# Patient Record
Sex: Female | Born: 1935
Health system: Southern US, Community
[De-identification: ages and names within clinical notes are randomized; demographics above are authoritative.]

## PROBLEM LIST (undated history)

## (undated) DIAGNOSIS — Z860100 Personal history of colon polyps, unspecified: Secondary | ICD-10-CM

## (undated) DIAGNOSIS — E785 Hyperlipidemia, unspecified: Secondary | ICD-10-CM

## (undated) DIAGNOSIS — M1990 Unspecified osteoarthritis, unspecified site: Secondary | ICD-10-CM

## (undated) DIAGNOSIS — I10 Essential (primary) hypertension: Secondary | ICD-10-CM

## (undated) DIAGNOSIS — K219 Gastro-esophageal reflux disease without esophagitis: Secondary | ICD-10-CM

## (undated) DIAGNOSIS — E079 Disorder of thyroid, unspecified: Secondary | ICD-10-CM

## (undated) DIAGNOSIS — T7840XA Allergy, unspecified, initial encounter: Secondary | ICD-10-CM

## (undated) HISTORY — DX: Hyperlipidemia, unspecified: E78.5

## (undated) HISTORY — DX: Gastro-esophageal reflux disease without esophagitis: K21.9

## (undated) HISTORY — DX: Unspecified osteoarthritis, unspecified site: M19.90

## (undated) HISTORY — DX: Essential (primary) hypertension: I10

## (undated) HISTORY — DX: Allergy, unspecified, initial encounter: T78.40XA

## (undated) HISTORY — DX: Personal history of colon polyps, unspecified: Z86.0100

## (undated) HISTORY — DX: Disorder of thyroid, unspecified: E07.9

## (undated) HISTORY — DX: Personal history of colonic polyps: Z86.010

---
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
## 1978-04-10 HISTORY — PX: BREAST EXCISIONAL BIOPSY: SUR124

## 2004-12-15 ENCOUNTER — Ambulatory Visit: Payer: Self-pay | Admitting: Internal Medicine

## 2005-12-19 ENCOUNTER — Ambulatory Visit: Payer: Self-pay | Admitting: Internal Medicine

## 2006-12-27 ENCOUNTER — Ambulatory Visit: Payer: Self-pay | Admitting: Internal Medicine

## 2007-12-17 ENCOUNTER — Ambulatory Visit: Payer: Self-pay | Admitting: Internal Medicine

## 2008-04-13 ENCOUNTER — Ambulatory Visit: Payer: Self-pay | Admitting: General Surgery

## 2009-04-10 HISTORY — PX: COLONOSCOPY: SHX174

## 2009-04-19 ENCOUNTER — Ambulatory Visit: Payer: Self-pay | Admitting: General Surgery

## 2009-12-10 ENCOUNTER — Ambulatory Visit: Payer: Self-pay | Admitting: General Surgery

## 2009-12-14 LAB — PATHOLOGY REPORT

## 2010-04-20 ENCOUNTER — Ambulatory Visit: Payer: Self-pay | Admitting: Internal Medicine

## 2011-04-24 ENCOUNTER — Ambulatory Visit: Payer: Self-pay | Admitting: Internal Medicine

## 2012-04-24 ENCOUNTER — Ambulatory Visit: Payer: Self-pay | Admitting: Internal Medicine

## 2012-10-22 ENCOUNTER — Encounter: Payer: Self-pay | Admitting: *Deleted

## 2013-04-25 ENCOUNTER — Ambulatory Visit: Payer: Self-pay | Admitting: Internal Medicine

## 2013-04-30 ENCOUNTER — Ambulatory Visit: Payer: Self-pay | Admitting: Internal Medicine

## 2014-02-09 ENCOUNTER — Encounter: Payer: Self-pay | Admitting: *Deleted

## 2014-04-27 ENCOUNTER — Ambulatory Visit: Payer: Self-pay | Admitting: Internal Medicine

## 2014-05-04 ENCOUNTER — Ambulatory Visit: Payer: Self-pay | Admitting: General Surgery

## 2014-05-18 ENCOUNTER — Ambulatory Visit (INDEPENDENT_AMBULATORY_CARE_PROVIDER_SITE_OTHER): Payer: Medicare Other | Admitting: General Surgery

## 2014-05-18 ENCOUNTER — Encounter: Payer: Self-pay | Admitting: General Surgery

## 2014-05-18 VITALS — BP 122/60 | HR 70 | Resp 13 | Ht 62.0 in | Wt 129.0 lb

## 2014-05-18 DIAGNOSIS — Z86010 Personal history of colonic polyps: Secondary | ICD-10-CM

## 2014-05-18 MED ORDER — POLYETHYLENE GLYCOL 3350 17 GM/SCOOP PO POWD: ORAL | Status: DC

## 2014-05-18 NOTE — Patient Instructions (Addendum)
Patient has been scheduled for a colonoscopy on 06-10-14 at [facility].  Colonoscopy A colonoscopy is an exam to look at the entire large intestine (colon). This exam can help find problems such as tumors, polyps, inflammation, and areas of bleeding. The exam takes about 1 hour.  LET [facility] CARE PROVIDER KNOW ABOUT:   Any allergies you have.  All medicines you are taking, including vitamins, herbs, eye drops, creams, and over-the-counter medicines.  Previous problems you or members of your family have had with the use of anesthetics.  Any blood disorders you have.  Previous surgeries you have had.  Medical conditions you have. RISKS AND COMPLICATIONS  Generally, this is a safe procedure. However, as with any procedure, complications can occur. Possible complications include:  Bleeding.  Tearing or rupture of the colon wall.  Reaction to medicines given during the exam.  Infection (rare). BEFORE THE PROCEDURE   Ask your health care provider about changing or stopping your regular medicines.  You may be prescribed an oral bowel prep. This involves drinking a large amount of medicated liquid, starting the day before your procedure. The liquid will cause you to have multiple loose stools until your stool is almost clear or light green. This cleans out your colon in preparation for the procedure.  Do not eat or drink anything else once you have started the bowel prep, unless your health care provider tells you it is safe to do so.  Arrange for someone to drive you home after the procedure. PROCEDURE   You will be given medicine to help you relax (sedative).  You will lie on your side with your knees bent.  A long, flexible tube with a light and camera on the end (colonoscope) will be inserted through the rectum and into the colon. The camera sends video back to a computer screen as it moves through the colon. The colonoscope also releases carbon dioxide gas to inflate the colon. This  helps your health care provider see the area better.  During the exam, your health care provider may take a small tissue sample (biopsy) to be examined under a microscope if any abnormalities are found.  The exam is finished when the entire colon has been viewed. AFTER THE PROCEDURE   Do not drive for 24 hours after the exam.  You may have a small amount of blood in your stool.  You may pass moderate amounts of gas and have mild abdominal cramping or bloating. This is caused by the gas used to inflate your colon during the exam.  Ask when your test results will be ready and how you will get your results. Make sure you get your test results. Document Released: 03/24/2000 Document Revised: 01/15/2013 Document Reviewed: 12/02/2012 [facility] Patient Information 2015 [place], [place]. This information is not intended to replace advice given to you by your health care provider. Make sure you discuss any questions you have with your health care provider.

## 2014-05-18 NOTE — Progress Notes (Signed)
Patient ID: Michele Jordan, female   DOB: December 08, 1932, 81 y.o.   MRN: [id number]  Chief Complaint  Patient presents with  . Other    colonoscopy    HPI Michele Jordan is a 81 y.o. female here today for a evaluation of a surveillance colonoscopy. Last colonoscopy was done 12/10/09 and had a serrated adenoma removed.She states no GI problems at this time.  HPI  Past Medical History  Diagnosis Date  . Arthritis   . Hypertension   . Hyperlipidemia   . Personal history of colonic polyps     Past Surgical History  Procedure Laterality Date  . Appendectomy  1950  . Breast biopsy Left 1980  . Colonoscopy  2011    History reviewed. No pertinent family history.  Social History History  Substance Use Topics  . Smoking status: Never Smoker   . Smokeless tobacco: Not on file  . Alcohol Use: No    No Known Allergies  Current Outpatient Prescriptions  Medication Sig Dispense Refill  . cetirizine (ZYRTEC) 10 MG tablet Take 10 mg by mouth daily.    Marland Kitchen esomeprazole (NEXIUM) 20 MG capsule Take 20 mg by mouth daily at 12 noon.    . fluticasone (FLONASE) 50 MCG/ACT nasal spray Place into both nostrils daily.    Marland Kitchen lisinopril-hydrochlorothiazide (PRINZIDE,ZESTORETIC) 20-12.5 MG per tablet Take 1 tablet by mouth daily.    . simvastatin (ZOCOR) 40 MG tablet Take 40 mg by mouth daily.    . traMADol (ULTRAM) 50 MG tablet Take by mouth every 6 (six) hours as needed.    . polyethylene glycol powder (GLYCOLAX/MIRALAX) powder 255 grams one bottle for colonoscopy prep 255 g 0   No current facility-administered medications for this visit.    Review of Systems Review of Systems  Constitutional: Negative.   Respiratory: Negative.   Cardiovascular: Negative.     Blood pressure 122/60, pulse 70, resp. rate 13, height 5\' 2"  (1.575 m), weight 129 lb (58.514 kg).  Physical Exam Physical Exam  Constitutional: She is oriented to person, place, and time. She appears well-developed and  well-nourished.  Eyes: Conjunctivae are normal. No scleral icterus.  Neck: Neck supple.  Cardiovascular: Normal rate, regular rhythm and normal heart sounds.   Pulmonary/Chest: Effort normal and breath sounds normal.  Abdominal: Soft. Bowel sounds are normal. There is no tenderness.  Lymphadenopathy:    She has no cervical adenopathy.  Neurological: She is alert and oriented to person, place, and time.  Skin: Skin is warm and dry.    Data Reviewed None  Assessment    History of colon propyl     Plan      Discussed colonoscopy, patient agrees.   Patient has been scheduled for a colonoscopy on 06-10-14 at [facility].   SANKAR,SEEPLAPUTHUR G 05/18/2014, 6:36 PM

## 2014-06-03 ENCOUNTER — Other Ambulatory Visit: Payer: Self-pay | Admitting: General Surgery

## 2014-06-03 DIAGNOSIS — Z86010 Personal history of colonic polyps: Secondary | ICD-10-CM

## 2014-06-10 ENCOUNTER — Ambulatory Visit: Payer: Self-pay | Admitting: General Surgery

## 2014-06-10 DIAGNOSIS — Z86010 Personal history of colonic polyps: Secondary | ICD-10-CM | POA: Diagnosis not present

## 2014-06-11 ENCOUNTER — Encounter: Payer: Self-pay | Admitting: General Surgery

## 2014-07-28 ENCOUNTER — Encounter: Payer: Self-pay | Admitting: General Surgery

## 2015-04-27 ENCOUNTER — Other Ambulatory Visit: Payer: Self-pay | Admitting: Internal Medicine

## 2015-04-27 DIAGNOSIS — Z1231 Encounter for screening mammogram for malignant neoplasm of breast: Secondary | ICD-10-CM

## 2015-05-04 ENCOUNTER — Ambulatory Visit
Admission: RE | Admit: 2015-05-04 | Discharge: 2015-05-04 | Disposition: A | Discharge status: Home / Self Care | Payer: PPO | Source: Ambulatory Visit | Attending: Internal Medicine | Admitting: Internal Medicine

## 2015-05-04 DIAGNOSIS — Z1231 Encounter for screening mammogram for malignant neoplasm of breast: Secondary | ICD-10-CM | POA: Insufficient documentation

## 2015-08-16 DIAGNOSIS — N183 Chronic kidney disease, stage 3 (moderate): Secondary | ICD-10-CM | POA: Diagnosis not present

## 2015-08-16 DIAGNOSIS — R739 Hyperglycemia, unspecified: Secondary | ICD-10-CM | POA: Diagnosis not present

## 2015-08-16 DIAGNOSIS — I129 Hypertensive chronic kidney disease with stage 1 through stage 4 chronic kidney disease, or unspecified chronic kidney disease: Secondary | ICD-10-CM | POA: Diagnosis not present

## 2015-08-16 DIAGNOSIS — E7800 Pure hypercholesterolemia, unspecified: Secondary | ICD-10-CM | POA: Diagnosis not present

## 2015-08-23 DIAGNOSIS — I129 Hypertensive chronic kidney disease with stage 1 through stage 4 chronic kidney disease, or unspecified chronic kidney disease: Secondary | ICD-10-CM | POA: Diagnosis not present

## 2015-08-23 DIAGNOSIS — E039 Hypothyroidism, unspecified: Secondary | ICD-10-CM | POA: Diagnosis not present

## 2015-08-23 DIAGNOSIS — E7800 Pure hypercholesterolemia, unspecified: Secondary | ICD-10-CM | POA: Diagnosis not present

## 2015-08-23 DIAGNOSIS — R739 Hyperglycemia, unspecified: Secondary | ICD-10-CM | POA: Diagnosis not present

## 2015-08-23 DIAGNOSIS — N183 Chronic kidney disease, stage 3 (moderate): Secondary | ICD-10-CM | POA: Diagnosis not present

## 2016-01-18 DIAGNOSIS — Z23 Encounter for immunization: Secondary | ICD-10-CM | POA: Diagnosis not present

## 2016-02-23 DIAGNOSIS — L821 Other seborrheic keratosis: Secondary | ICD-10-CM | POA: Diagnosis not present

## 2016-02-23 DIAGNOSIS — L570 Actinic keratosis: Secondary | ICD-10-CM | POA: Diagnosis not present

## 2016-02-23 DIAGNOSIS — D2261 Melanocytic nevi of right upper limb, including shoulder: Secondary | ICD-10-CM | POA: Diagnosis not present

## 2016-02-23 DIAGNOSIS — D2272 Melanocytic nevi of left lower limb, including hip: Secondary | ICD-10-CM | POA: Diagnosis not present

## 2016-02-23 DIAGNOSIS — D225 Melanocytic nevi of trunk: Secondary | ICD-10-CM | POA: Diagnosis not present

## 2016-02-23 DIAGNOSIS — D485 Neoplasm of uncertain behavior of skin: Secondary | ICD-10-CM | POA: Diagnosis not present

## 2016-03-09 DIAGNOSIS — H04123 Dry eye syndrome of bilateral lacrimal glands: Secondary | ICD-10-CM | POA: Diagnosis not present

## 2016-03-09 DIAGNOSIS — H2513 Age-related nuclear cataract, bilateral: Secondary | ICD-10-CM | POA: Diagnosis not present

## 2016-03-09 DIAGNOSIS — I10 Essential (primary) hypertension: Secondary | ICD-10-CM | POA: Diagnosis not present

## 2016-03-09 DIAGNOSIS — H16223 Keratoconjunctivitis sicca, not specified as Sjogren's, bilateral: Secondary | ICD-10-CM | POA: Diagnosis not present

## 2016-03-09 DIAGNOSIS — H35033 Hypertensive retinopathy, bilateral: Secondary | ICD-10-CM | POA: Diagnosis not present

## 2016-03-09 DIAGNOSIS — H35013 Changes in retinal vascular appearance, bilateral: Secondary | ICD-10-CM | POA: Diagnosis not present

## 2016-03-09 DIAGNOSIS — H35433 Paving stone degeneration of retina, bilateral: Secondary | ICD-10-CM | POA: Diagnosis not present

## 2016-03-15 DIAGNOSIS — I129 Hypertensive chronic kidney disease with stage 1 through stage 4 chronic kidney disease, or unspecified chronic kidney disease: Secondary | ICD-10-CM | POA: Diagnosis not present

## 2016-03-15 DIAGNOSIS — N183 Chronic kidney disease, stage 3 (moderate): Secondary | ICD-10-CM | POA: Diagnosis not present

## 2016-03-15 DIAGNOSIS — E039 Hypothyroidism, unspecified: Secondary | ICD-10-CM | POA: Diagnosis not present

## 2016-03-15 DIAGNOSIS — E7800 Pure hypercholesterolemia, unspecified: Secondary | ICD-10-CM | POA: Diagnosis not present

## 2016-03-15 DIAGNOSIS — X32XXXA Exposure to sunlight, initial encounter: Secondary | ICD-10-CM | POA: Diagnosis not present

## 2016-03-15 DIAGNOSIS — L570 Actinic keratosis: Secondary | ICD-10-CM | POA: Diagnosis not present

## 2016-03-20 DIAGNOSIS — N183 Chronic kidney disease, stage 3 (moderate): Secondary | ICD-10-CM | POA: Diagnosis not present

## 2016-03-20 DIAGNOSIS — E7800 Pure hypercholesterolemia, unspecified: Secondary | ICD-10-CM | POA: Diagnosis not present

## 2016-03-20 DIAGNOSIS — I129 Hypertensive chronic kidney disease with stage 1 through stage 4 chronic kidney disease, or unspecified chronic kidney disease: Secondary | ICD-10-CM | POA: Diagnosis not present

## 2016-03-20 DIAGNOSIS — E039 Hypothyroidism, unspecified: Secondary | ICD-10-CM | POA: Diagnosis not present

## 2016-03-20 DIAGNOSIS — R739 Hyperglycemia, unspecified: Secondary | ICD-10-CM | POA: Diagnosis not present

## 2016-03-20 DIAGNOSIS — Z0000 Encounter for general adult medical examination without abnormal findings: Secondary | ICD-10-CM | POA: Diagnosis not present

## 2016-03-29 ENCOUNTER — Other Ambulatory Visit: Payer: Self-pay | Admitting: Internal Medicine

## 2016-03-29 DIAGNOSIS — Z1231 Encounter for screening mammogram for malignant neoplasm of breast: Secondary | ICD-10-CM

## 2016-05-04 ENCOUNTER — Ambulatory Visit
Admission: RE | Admit: 2016-05-04 | Discharge: 2016-05-04 | Disposition: A | Discharge status: Home / Self Care | Payer: PPO | Source: Ambulatory Visit | Attending: Internal Medicine | Admitting: Internal Medicine

## 2016-05-04 DIAGNOSIS — Z1231 Encounter for screening mammogram for malignant neoplasm of breast: Secondary | ICD-10-CM | POA: Insufficient documentation

## 2016-05-10 ENCOUNTER — Other Ambulatory Visit: Payer: Self-pay | Admitting: Internal Medicine

## 2016-05-10 DIAGNOSIS — R928 Other abnormal and inconclusive findings on diagnostic imaging of breast: Secondary | ICD-10-CM

## 2016-05-10 DIAGNOSIS — N6320 Unspecified lump in the left breast, unspecified quadrant: Secondary | ICD-10-CM

## 2016-05-23 ENCOUNTER — Ambulatory Visit
Admission: RE | Admit: 2016-05-23 | Discharge: 2016-05-23 | Disposition: A | Discharge status: Home / Self Care | Payer: PPO | Source: Ambulatory Visit | Attending: Internal Medicine | Admitting: Internal Medicine

## 2016-05-23 DIAGNOSIS — N63 Unspecified lump in unspecified breast: Secondary | ICD-10-CM | POA: Diagnosis not present

## 2016-05-23 DIAGNOSIS — N6002 Solitary cyst of left breast: Secondary | ICD-10-CM | POA: Diagnosis not present

## 2016-05-23 DIAGNOSIS — R928 Other abnormal and inconclusive findings on diagnostic imaging of breast: Secondary | ICD-10-CM

## 2016-05-23 DIAGNOSIS — N6320 Unspecified lump in the left breast, unspecified quadrant: Secondary | ICD-10-CM

## 2016-09-11 DIAGNOSIS — E7800 Pure hypercholesterolemia, unspecified: Secondary | ICD-10-CM | POA: Diagnosis not present

## 2016-09-11 DIAGNOSIS — N183 Chronic kidney disease, stage 3 (moderate): Secondary | ICD-10-CM | POA: Diagnosis not present

## 2016-09-11 DIAGNOSIS — E039 Hypothyroidism, unspecified: Secondary | ICD-10-CM | POA: Diagnosis not present

## 2016-09-11 DIAGNOSIS — I129 Hypertensive chronic kidney disease with stage 1 through stage 4 chronic kidney disease, or unspecified chronic kidney disease: Secondary | ICD-10-CM | POA: Diagnosis not present

## 2016-09-11 DIAGNOSIS — R739 Hyperglycemia, unspecified: Secondary | ICD-10-CM | POA: Diagnosis not present

## 2016-09-18 DIAGNOSIS — I129 Hypertensive chronic kidney disease with stage 1 through stage 4 chronic kidney disease, or unspecified chronic kidney disease: Secondary | ICD-10-CM | POA: Diagnosis not present

## 2016-09-18 DIAGNOSIS — E7800 Pure hypercholesterolemia, unspecified: Secondary | ICD-10-CM | POA: Diagnosis not present

## 2016-09-18 DIAGNOSIS — N183 Chronic kidney disease, stage 3 (moderate): Secondary | ICD-10-CM | POA: Diagnosis not present

## 2016-09-18 DIAGNOSIS — E039 Hypothyroidism, unspecified: Secondary | ICD-10-CM | POA: Diagnosis not present

## 2016-09-18 DIAGNOSIS — R739 Hyperglycemia, unspecified: Secondary | ICD-10-CM | POA: Diagnosis not present

## 2016-12-13 DIAGNOSIS — E039 Hypothyroidism, unspecified: Secondary | ICD-10-CM | POA: Diagnosis not present

## 2016-12-20 DIAGNOSIS — N183 Chronic kidney disease, stage 3 (moderate): Secondary | ICD-10-CM | POA: Diagnosis not present

## 2016-12-20 DIAGNOSIS — I129 Hypertensive chronic kidney disease with stage 1 through stage 4 chronic kidney disease, or unspecified chronic kidney disease: Secondary | ICD-10-CM | POA: Diagnosis not present

## 2016-12-20 DIAGNOSIS — E7800 Pure hypercholesterolemia, unspecified: Secondary | ICD-10-CM | POA: Diagnosis not present

## 2016-12-20 DIAGNOSIS — Z0000 Encounter for general adult medical examination without abnormal findings: Secondary | ICD-10-CM | POA: Diagnosis not present

## 2016-12-20 DIAGNOSIS — E039 Hypothyroidism, unspecified: Secondary | ICD-10-CM | POA: Diagnosis not present

## 2016-12-20 DIAGNOSIS — R739 Hyperglycemia, unspecified: Secondary | ICD-10-CM | POA: Diagnosis not present

## 2016-12-29 DIAGNOSIS — Z23 Encounter for immunization: Secondary | ICD-10-CM | POA: Diagnosis not present

## 2017-02-21 DIAGNOSIS — X32XXXA Exposure to sunlight, initial encounter: Secondary | ICD-10-CM | POA: Diagnosis not present

## 2017-02-21 DIAGNOSIS — L570 Actinic keratosis: Secondary | ICD-10-CM | POA: Diagnosis not present

## 2017-02-21 DIAGNOSIS — L821 Other seborrheic keratosis: Secondary | ICD-10-CM | POA: Diagnosis not present

## 2017-03-06 DIAGNOSIS — H5203 Hypermetropia, bilateral: Secondary | ICD-10-CM | POA: Diagnosis not present

## 2017-03-06 DIAGNOSIS — H52223 Regular astigmatism, bilateral: Secondary | ICD-10-CM | POA: Diagnosis not present

## 2017-03-06 DIAGNOSIS — H524 Presbyopia: Secondary | ICD-10-CM | POA: Diagnosis not present

## 2017-03-06 DIAGNOSIS — H04123 Dry eye syndrome of bilateral lacrimal glands: Secondary | ICD-10-CM | POA: Diagnosis not present

## 2017-03-06 DIAGNOSIS — H2513 Age-related nuclear cataract, bilateral: Secondary | ICD-10-CM | POA: Diagnosis not present

## 2017-04-26 ENCOUNTER — Other Ambulatory Visit: Payer: Self-pay | Admitting: Internal Medicine

## 2017-04-26 DIAGNOSIS — Z1231 Encounter for screening mammogram for malignant neoplasm of breast: Secondary | ICD-10-CM

## 2017-05-24 ENCOUNTER — Ambulatory Visit
Admission: RE | Admit: 2017-05-24 | Discharge: 2017-05-24 | Disposition: A | Discharge status: Home / Self Care | Payer: PPO | Source: Ambulatory Visit | Attending: Internal Medicine | Admitting: Internal Medicine

## 2017-05-24 DIAGNOSIS — Z1231 Encounter for screening mammogram for malignant neoplasm of breast: Secondary | ICD-10-CM

## 2017-06-13 DIAGNOSIS — N183 Chronic kidney disease, stage 3 (moderate): Secondary | ICD-10-CM | POA: Diagnosis not present

## 2017-06-13 DIAGNOSIS — R739 Hyperglycemia, unspecified: Secondary | ICD-10-CM | POA: Diagnosis not present

## 2017-06-13 DIAGNOSIS — I129 Hypertensive chronic kidney disease with stage 1 through stage 4 chronic kidney disease, or unspecified chronic kidney disease: Secondary | ICD-10-CM | POA: Diagnosis not present

## 2017-06-13 DIAGNOSIS — E7800 Pure hypercholesterolemia, unspecified: Secondary | ICD-10-CM | POA: Diagnosis not present

## 2017-06-13 DIAGNOSIS — E039 Hypothyroidism, unspecified: Secondary | ICD-10-CM | POA: Diagnosis not present

## 2017-06-20 DIAGNOSIS — Z0000 Encounter for general adult medical examination without abnormal findings: Secondary | ICD-10-CM | POA: Diagnosis not present

## 2017-06-20 DIAGNOSIS — E039 Hypothyroidism, unspecified: Secondary | ICD-10-CM | POA: Diagnosis not present

## 2017-06-20 DIAGNOSIS — E7800 Pure hypercholesterolemia, unspecified: Secondary | ICD-10-CM | POA: Diagnosis not present

## 2017-06-20 DIAGNOSIS — I129 Hypertensive chronic kidney disease with stage 1 through stage 4 chronic kidney disease, or unspecified chronic kidney disease: Secondary | ICD-10-CM | POA: Diagnosis not present

## 2017-06-20 DIAGNOSIS — R739 Hyperglycemia, unspecified: Secondary | ICD-10-CM | POA: Diagnosis not present

## 2017-06-20 DIAGNOSIS — F5104 Psychophysiologic insomnia: Secondary | ICD-10-CM | POA: Diagnosis not present

## 2017-06-20 DIAGNOSIS — N183 Chronic kidney disease, stage 3 (moderate): Secondary | ICD-10-CM | POA: Diagnosis not present

## 2017-12-20 DIAGNOSIS — E7800 Pure hypercholesterolemia, unspecified: Secondary | ICD-10-CM | POA: Diagnosis not present

## 2017-12-20 DIAGNOSIS — E039 Hypothyroidism, unspecified: Secondary | ICD-10-CM | POA: Diagnosis not present

## 2017-12-20 DIAGNOSIS — R739 Hyperglycemia, unspecified: Secondary | ICD-10-CM | POA: Diagnosis not present

## 2017-12-27 DIAGNOSIS — I129 Hypertensive chronic kidney disease with stage 1 through stage 4 chronic kidney disease, or unspecified chronic kidney disease: Secondary | ICD-10-CM | POA: Diagnosis not present

## 2017-12-27 DIAGNOSIS — E7800 Pure hypercholesterolemia, unspecified: Secondary | ICD-10-CM | POA: Diagnosis not present

## 2017-12-27 DIAGNOSIS — E039 Hypothyroidism, unspecified: Secondary | ICD-10-CM | POA: Diagnosis not present

## 2017-12-27 DIAGNOSIS — N183 Chronic kidney disease, stage 3 (moderate): Secondary | ICD-10-CM | POA: Diagnosis not present

## 2017-12-27 DIAGNOSIS — R739 Hyperglycemia, unspecified: Secondary | ICD-10-CM | POA: Diagnosis not present

## 2018-01-18 DIAGNOSIS — Z23 Encounter for immunization: Secondary | ICD-10-CM | POA: Diagnosis not present

## 2018-01-24 ENCOUNTER — Encounter: Payer: Self-pay | Admitting: Dietician

## 2018-01-24 ENCOUNTER — Encounter: Payer: PPO | Attending: Internal Medicine | Admitting: Dietician

## 2018-01-24 VITALS — Ht 61.5 in | Wt 136.3 lb

## 2018-01-24 DIAGNOSIS — E785 Hyperlipidemia, unspecified: Secondary | ICD-10-CM | POA: Insufficient documentation

## 2018-01-24 DIAGNOSIS — N183 Chronic kidney disease, stage 3 unspecified: Secondary | ICD-10-CM

## 2018-01-24 DIAGNOSIS — N189 Chronic kidney disease, unspecified: Secondary | ICD-10-CM | POA: Diagnosis not present

## 2018-01-24 DIAGNOSIS — I129 Hypertensive chronic kidney disease with stage 1 through stage 4 chronic kidney disease, or unspecified chronic kidney disease: Secondary | ICD-10-CM | POA: Insufficient documentation

## 2018-01-24 DIAGNOSIS — I10 Essential (primary) hypertension: Secondary | ICD-10-CM

## 2018-01-24 DIAGNOSIS — Z713 Dietary counseling and surveillance: Secondary | ICD-10-CM | POA: Insufficient documentation

## 2018-01-24 NOTE — Progress Notes (Signed)
Medical Nutrition Therapy: Visit start time: 1330  end time: 1430  Assessment:  Diagnosis: HTN, CKD Past medical history: hyperlipidemia, kidney stones, recent elevated HbA1C Psychosocial issues/ stress concerns: none  Preferred learning method:  . Auditory . Visual . Hands-on   Current weight: 136.3lbs Height: 5'1.5" Medications, supplements: reconciled list in medical record  Progress and evaluation: Patient reports recent elevation in HbA1C, with result of 6.6%, and BG of 117. She is working to follow a low sodium diet to manage blood pressure and kidney health. She denies any other dietary restrictions at this time. Recent GFR was 36. She cooks meals almost daily at home, and would like help with options for easy to prepare meals while maintaining healthy diet.    Physical activity: walk, stationary bike, other strength building machines 1 hour 3x a week  Dietary Intake:  Usual eating pattern includes 3 meals and 0-1 snacks per day. Dining out frequency: 1 meals per week.  Breakfast: 1c coffee with powdered creamer, no sugar + cereal (low sugar) or scrambled egg with toast 1 slice; breakfast bar Snack: none Lunch: 12pm chicken pie, chicken salad; egg rolls with steamed cabbage and green beans (occ casserole) or squash; spaghetti with small amount meat (sausage), chicken and rice casserole; fruit salad apple blueberries, grapes, tangerines with cool whip Snack: fruit salad if hungry Supper: sandwich with pimento cheese or grilled cheese or [place]; chicken salad on lettuce; sometimes cereal if not very hungry Snack: usually none Beverages: water, 1c coffee, no tea d/t history of kidney stones  Nutrition Care Education: Topics covered: low sodium diet, diabetes Basic nutrition: basic food groups, appropriate nutrient balance, appropriate meal and snack schedule, general nutrition guidelines    Advanced nutrition: food label reading Diabetes: appropriate carb intake and balance,  basic meal planning using plate method, options for balanced meals and to avoid excess cooking time.  Hypertension/ CKD: identifying high sodium foods, identifying food sources of potassium, magnesium  Nutritional Diagnosis:  [place]-2.2 Altered nutrition-related laboratory As related to CKD and hypertension, diabetes.  As evidenced by patient with GFR of 36, and HbA1C of 6.6%.  Intervention: Instruction as noted above.   Patient is generally making healthy food choices and controlling food portions.    She will use resources such as menus provided for additional healthy options.    No follow-up scheduled at this time; patient to call with any questions or concerns.   Education Materials given:  . Plate Planner with food lists . Sample menus . Diabetes-friendly recipes . Goals/ instructions   Learner/ who was taught:  . Patient  . Family member: daughter Barnett Applebaum   Level of understanding: Marland Kitchen Verbalizes/ demonstrates competency   Demonstrated degree of understanding via:   Teach back Learning barriers: . None  Willingness to learn/ readiness for change: . Eager, change in progress   Monitoring and Evaluation:  Dietary intake, exercise, renal function, BG control, and body weight      follow up: prn

## 2018-01-24 NOTE — Patient Instructions (Signed)
   Continue to eat balanced meals with small portions of starches, plenty of low-carb veggies, and lean protein foods in moderate portions (palm-size).   Try Mrs. Dash seasonings for flavoring foods without sodium.   Keep drinking plenty of water.

## 2018-02-20 DIAGNOSIS — X32XXXA Exposure to sunlight, initial encounter: Secondary | ICD-10-CM | POA: Diagnosis not present

## 2018-02-20 DIAGNOSIS — L570 Actinic keratosis: Secondary | ICD-10-CM | POA: Diagnosis not present

## 2018-02-20 DIAGNOSIS — L821 Other seborrheic keratosis: Secondary | ICD-10-CM | POA: Diagnosis not present

## 2018-03-12 DIAGNOSIS — H25813 Combined forms of age-related cataract, bilateral: Secondary | ICD-10-CM | POA: Diagnosis not present

## 2018-05-02 ENCOUNTER — Other Ambulatory Visit: Payer: Self-pay | Admitting: Internal Medicine

## 2018-05-02 DIAGNOSIS — Z1231 Encounter for screening mammogram for malignant neoplasm of breast: Secondary | ICD-10-CM

## 2018-05-27 ENCOUNTER — Ambulatory Visit
Admission: RE | Admit: 2018-05-27 | Discharge: 2018-05-27 | Disposition: A | Discharge status: Home / Self Care | Payer: PPO | Source: Ambulatory Visit | Attending: Internal Medicine | Admitting: Internal Medicine

## 2018-05-27 DIAGNOSIS — Z1231 Encounter for screening mammogram for malignant neoplasm of breast: Secondary | ICD-10-CM | POA: Insufficient documentation

## 2018-06-20 DIAGNOSIS — I129 Hypertensive chronic kidney disease with stage 1 through stage 4 chronic kidney disease, or unspecified chronic kidney disease: Secondary | ICD-10-CM | POA: Diagnosis not present

## 2018-06-20 DIAGNOSIS — R739 Hyperglycemia, unspecified: Secondary | ICD-10-CM | POA: Diagnosis not present

## 2018-06-20 DIAGNOSIS — E039 Hypothyroidism, unspecified: Secondary | ICD-10-CM | POA: Diagnosis not present

## 2018-06-20 DIAGNOSIS — N183 Chronic kidney disease, stage 3 (moderate): Secondary | ICD-10-CM | POA: Diagnosis not present

## 2018-10-02 DIAGNOSIS — I129 Hypertensive chronic kidney disease with stage 1 through stage 4 chronic kidney disease, or unspecified chronic kidney disease: Secondary | ICD-10-CM | POA: Diagnosis not present

## 2018-10-02 DIAGNOSIS — E039 Hypothyroidism, unspecified: Secondary | ICD-10-CM | POA: Diagnosis not present

## 2018-10-02 DIAGNOSIS — E119 Type 2 diabetes mellitus without complications: Secondary | ICD-10-CM | POA: Diagnosis not present

## 2018-10-02 DIAGNOSIS — E7800 Pure hypercholesterolemia, unspecified: Secondary | ICD-10-CM | POA: Diagnosis not present

## 2018-10-02 DIAGNOSIS — Z0000 Encounter for general adult medical examination without abnormal findings: Secondary | ICD-10-CM | POA: Diagnosis not present

## 2018-10-02 DIAGNOSIS — N183 Chronic kidney disease, stage 3 (moderate): Secondary | ICD-10-CM | POA: Diagnosis not present

## 2018-10-02 DIAGNOSIS — N2581 Secondary hyperparathyroidism of renal origin: Secondary | ICD-10-CM | POA: Diagnosis not present

## 2019-02-19 DIAGNOSIS — D045 Carcinoma in situ of skin of trunk: Secondary | ICD-10-CM | POA: Diagnosis not present

## 2019-02-19 DIAGNOSIS — X32XXXA Exposure to sunlight, initial encounter: Secondary | ICD-10-CM | POA: Diagnosis not present

## 2019-02-19 DIAGNOSIS — L570 Actinic keratosis: Secondary | ICD-10-CM | POA: Diagnosis not present

## 2019-02-19 DIAGNOSIS — L821 Other seborrheic keratosis: Secondary | ICD-10-CM | POA: Diagnosis not present

## 2019-02-19 DIAGNOSIS — D2272 Melanocytic nevi of left lower limb, including hip: Secondary | ICD-10-CM | POA: Diagnosis not present

## 2019-02-19 DIAGNOSIS — D2261 Melanocytic nevi of right upper limb, including shoulder: Secondary | ICD-10-CM | POA: Diagnosis not present

## 2019-02-19 DIAGNOSIS — D225 Melanocytic nevi of trunk: Secondary | ICD-10-CM | POA: Diagnosis not present

## 2019-02-19 DIAGNOSIS — D2271 Melanocytic nevi of right lower limb, including hip: Secondary | ICD-10-CM | POA: Diagnosis not present

## 2019-02-19 DIAGNOSIS — D2262 Melanocytic nevi of left upper limb, including shoulder: Secondary | ICD-10-CM | POA: Diagnosis not present

## 2019-02-19 DIAGNOSIS — D485 Neoplasm of uncertain behavior of skin: Secondary | ICD-10-CM | POA: Diagnosis not present

## 2019-03-13 DIAGNOSIS — D045 Carcinoma in situ of skin of trunk: Secondary | ICD-10-CM | POA: Diagnosis not present

## 2019-03-13 DIAGNOSIS — L570 Actinic keratosis: Secondary | ICD-10-CM | POA: Diagnosis not present

## 2019-03-31 DIAGNOSIS — E119 Type 2 diabetes mellitus without complications: Secondary | ICD-10-CM | POA: Diagnosis not present

## 2019-03-31 DIAGNOSIS — I129 Hypertensive chronic kidney disease with stage 1 through stage 4 chronic kidney disease, or unspecified chronic kidney disease: Secondary | ICD-10-CM | POA: Diagnosis not present

## 2019-03-31 DIAGNOSIS — N183 Chronic kidney disease, stage 3 unspecified: Secondary | ICD-10-CM | POA: Diagnosis not present

## 2019-03-31 DIAGNOSIS — E039 Hypothyroidism, unspecified: Secondary | ICD-10-CM | POA: Diagnosis not present

## 2019-03-31 DIAGNOSIS — E7800 Pure hypercholesterolemia, unspecified: Secondary | ICD-10-CM | POA: Diagnosis not present

## 2019-04-07 DIAGNOSIS — E039 Hypothyroidism, unspecified: Secondary | ICD-10-CM | POA: Diagnosis not present

## 2019-04-07 DIAGNOSIS — E7800 Pure hypercholesterolemia, unspecified: Secondary | ICD-10-CM | POA: Diagnosis not present

## 2019-04-07 DIAGNOSIS — N2581 Secondary hyperparathyroidism of renal origin: Secondary | ICD-10-CM | POA: Diagnosis not present

## 2019-04-07 DIAGNOSIS — E119 Type 2 diabetes mellitus without complications: Secondary | ICD-10-CM | POA: Diagnosis not present

## 2019-04-07 DIAGNOSIS — N183 Chronic kidney disease, stage 3 unspecified: Secondary | ICD-10-CM | POA: Diagnosis not present

## 2019-04-07 DIAGNOSIS — I129 Hypertensive chronic kidney disease with stage 1 through stage 4 chronic kidney disease, or unspecified chronic kidney disease: Secondary | ICD-10-CM | POA: Diagnosis not present

## 2019-04-21 ENCOUNTER — Other Ambulatory Visit: Payer: Self-pay | Admitting: Internal Medicine

## 2019-04-21 DIAGNOSIS — Z1231 Encounter for screening mammogram for malignant neoplasm of breast: Secondary | ICD-10-CM

## 2019-07-07 ENCOUNTER — Ambulatory Visit
Admission: RE | Admit: 2019-07-07 | Discharge: 2019-07-07 | Disposition: A | Discharge status: Home / Self Care | Payer: PPO | Source: Ambulatory Visit | Attending: Internal Medicine | Admitting: Internal Medicine

## 2019-07-07 DIAGNOSIS — Z1231 Encounter for screening mammogram for malignant neoplasm of breast: Secondary | ICD-10-CM | POA: Diagnosis not present

## 2019-07-29 DIAGNOSIS — H25813 Combined forms of age-related cataract, bilateral: Secondary | ICD-10-CM | POA: Diagnosis not present

## 2019-09-29 DIAGNOSIS — N183 Chronic kidney disease, stage 3 unspecified: Secondary | ICD-10-CM | POA: Diagnosis not present

## 2019-09-29 DIAGNOSIS — E039 Hypothyroidism, unspecified: Secondary | ICD-10-CM | POA: Diagnosis not present

## 2019-09-29 DIAGNOSIS — E7800 Pure hypercholesterolemia, unspecified: Secondary | ICD-10-CM | POA: Diagnosis not present

## 2019-09-29 DIAGNOSIS — I129 Hypertensive chronic kidney disease with stage 1 through stage 4 chronic kidney disease, or unspecified chronic kidney disease: Secondary | ICD-10-CM | POA: Diagnosis not present

## 2019-09-29 DIAGNOSIS — E119 Type 2 diabetes mellitus without complications: Secondary | ICD-10-CM | POA: Diagnosis not present

## 2019-10-06 DIAGNOSIS — Z0000 Encounter for general adult medical examination without abnormal findings: Secondary | ICD-10-CM | POA: Diagnosis not present

## 2019-10-06 DIAGNOSIS — E039 Hypothyroidism, unspecified: Secondary | ICD-10-CM | POA: Diagnosis not present

## 2019-10-06 DIAGNOSIS — N183 Chronic kidney disease, stage 3 unspecified: Secondary | ICD-10-CM | POA: Diagnosis not present

## 2019-10-06 DIAGNOSIS — I129 Hypertensive chronic kidney disease with stage 1 through stage 4 chronic kidney disease, or unspecified chronic kidney disease: Secondary | ICD-10-CM | POA: Diagnosis not present

## 2019-10-06 DIAGNOSIS — E119 Type 2 diabetes mellitus without complications: Secondary | ICD-10-CM | POA: Diagnosis not present

## 2019-10-06 DIAGNOSIS — N2581 Secondary hyperparathyroidism of renal origin: Secondary | ICD-10-CM | POA: Diagnosis not present

## 2020-02-20 DIAGNOSIS — L538 Other specified erythematous conditions: Secondary | ICD-10-CM | POA: Diagnosis not present

## 2020-02-20 DIAGNOSIS — L821 Other seborrheic keratosis: Secondary | ICD-10-CM | POA: Diagnosis not present

## 2020-02-20 DIAGNOSIS — L570 Actinic keratosis: Secondary | ICD-10-CM | POA: Diagnosis not present

## 2020-02-20 DIAGNOSIS — L820 Inflamed seborrheic keratosis: Secondary | ICD-10-CM | POA: Diagnosis not present

## 2020-02-20 DIAGNOSIS — L298 Other pruritus: Secondary | ICD-10-CM | POA: Diagnosis not present

## 2020-03-30 DIAGNOSIS — E119 Type 2 diabetes mellitus without complications: Secondary | ICD-10-CM | POA: Diagnosis not present

## 2020-03-30 DIAGNOSIS — E039 Hypothyroidism, unspecified: Secondary | ICD-10-CM | POA: Diagnosis not present

## 2020-03-30 DIAGNOSIS — I129 Hypertensive chronic kidney disease with stage 1 through stage 4 chronic kidney disease, or unspecified chronic kidney disease: Secondary | ICD-10-CM | POA: Diagnosis not present

## 2020-03-30 DIAGNOSIS — N183 Chronic kidney disease, stage 3 unspecified: Secondary | ICD-10-CM | POA: Diagnosis not present

## 2020-04-12 DIAGNOSIS — E119 Type 2 diabetes mellitus without complications: Secondary | ICD-10-CM | POA: Diagnosis not present

## 2020-04-12 DIAGNOSIS — I129 Hypertensive chronic kidney disease with stage 1 through stage 4 chronic kidney disease, or unspecified chronic kidney disease: Secondary | ICD-10-CM | POA: Diagnosis not present

## 2020-04-12 DIAGNOSIS — E039 Hypothyroidism, unspecified: Secondary | ICD-10-CM | POA: Diagnosis not present

## 2020-04-12 DIAGNOSIS — E7800 Pure hypercholesterolemia, unspecified: Secondary | ICD-10-CM | POA: Diagnosis not present

## 2020-04-12 DIAGNOSIS — N183 Chronic kidney disease, stage 3 unspecified: Secondary | ICD-10-CM | POA: Diagnosis not present

## 2020-04-12 DIAGNOSIS — N2581 Secondary hyperparathyroidism of renal origin: Secondary | ICD-10-CM | POA: Diagnosis not present

## 2020-06-11 ENCOUNTER — Other Ambulatory Visit: Payer: Self-pay | Admitting: Internal Medicine

## 2020-06-11 DIAGNOSIS — Z1231 Encounter for screening mammogram for malignant neoplasm of breast: Secondary | ICD-10-CM

## 2020-07-07 ENCOUNTER — Ambulatory Visit
Admission: RE | Admit: 2020-07-07 | Discharge: 2020-07-07 | Disposition: A | Discharge status: Home / Self Care | Payer: PPO | Source: Ambulatory Visit | Attending: Internal Medicine | Admitting: Internal Medicine

## 2020-07-07 ENCOUNTER — Other Ambulatory Visit: Payer: Self-pay

## 2020-07-07 DIAGNOSIS — Z1231 Encounter for screening mammogram for malignant neoplasm of breast: Secondary | ICD-10-CM | POA: Diagnosis not present

## 2020-07-29 DIAGNOSIS — H25813 Combined forms of age-related cataract, bilateral: Secondary | ICD-10-CM | POA: Diagnosis not present

## 2020-10-05 DIAGNOSIS — I129 Hypertensive chronic kidney disease with stage 1 through stage 4 chronic kidney disease, or unspecified chronic kidney disease: Secondary | ICD-10-CM | POA: Diagnosis not present

## 2020-10-05 DIAGNOSIS — E039 Hypothyroidism, unspecified: Secondary | ICD-10-CM | POA: Diagnosis not present

## 2020-10-05 DIAGNOSIS — E119 Type 2 diabetes mellitus without complications: Secondary | ICD-10-CM | POA: Diagnosis not present

## 2020-10-05 DIAGNOSIS — N183 Chronic kidney disease, stage 3 unspecified: Secondary | ICD-10-CM | POA: Diagnosis not present

## 2020-10-12 DIAGNOSIS — Z0000 Encounter for general adult medical examination without abnormal findings: Secondary | ICD-10-CM | POA: Diagnosis not present

## 2020-10-12 DIAGNOSIS — I129 Hypertensive chronic kidney disease with stage 1 through stage 4 chronic kidney disease, or unspecified chronic kidney disease: Secondary | ICD-10-CM | POA: Diagnosis not present

## 2020-10-12 DIAGNOSIS — N183 Chronic kidney disease, stage 3 unspecified: Secondary | ICD-10-CM | POA: Diagnosis not present

## 2020-10-12 DIAGNOSIS — N2581 Secondary hyperparathyroidism of renal origin: Secondary | ICD-10-CM | POA: Diagnosis not present

## 2020-10-12 DIAGNOSIS — E7800 Pure hypercholesterolemia, unspecified: Secondary | ICD-10-CM | POA: Diagnosis not present

## 2020-10-12 DIAGNOSIS — E039 Hypothyroidism, unspecified: Secondary | ICD-10-CM | POA: Diagnosis not present

## 2020-10-12 DIAGNOSIS — E119 Type 2 diabetes mellitus without complications: Secondary | ICD-10-CM | POA: Diagnosis not present

## 2021-02-24 DIAGNOSIS — L570 Actinic keratosis: Secondary | ICD-10-CM | POA: Diagnosis not present

## 2021-02-24 DIAGNOSIS — L821 Other seborrheic keratosis: Secondary | ICD-10-CM | POA: Diagnosis not present

## 2021-02-24 DIAGNOSIS — D1801 Hemangioma of skin and subcutaneous tissue: Secondary | ICD-10-CM | POA: Diagnosis not present

## 2021-02-24 DIAGNOSIS — S90121A Contusion of right lesser toe(s) without damage to nail, initial encounter: Secondary | ICD-10-CM | POA: Diagnosis not present

## 2021-02-24 DIAGNOSIS — X32XXXA Exposure to sunlight, initial encounter: Secondary | ICD-10-CM | POA: Diagnosis not present

## 2021-04-07 DIAGNOSIS — N183 Chronic kidney disease, stage 3 unspecified: Secondary | ICD-10-CM | POA: Diagnosis not present

## 2021-04-07 DIAGNOSIS — E7800 Pure hypercholesterolemia, unspecified: Secondary | ICD-10-CM | POA: Diagnosis not present

## 2021-04-07 DIAGNOSIS — E119 Type 2 diabetes mellitus without complications: Secondary | ICD-10-CM | POA: Diagnosis not present

## 2021-04-07 DIAGNOSIS — I129 Hypertensive chronic kidney disease with stage 1 through stage 4 chronic kidney disease, or unspecified chronic kidney disease: Secondary | ICD-10-CM | POA: Diagnosis not present

## 2021-04-07 DIAGNOSIS — E039 Hypothyroidism, unspecified: Secondary | ICD-10-CM | POA: Diagnosis not present

## 2021-04-14 DIAGNOSIS — N183 Chronic kidney disease, stage 3 unspecified: Secondary | ICD-10-CM | POA: Diagnosis not present

## 2021-04-14 DIAGNOSIS — E7800 Pure hypercholesterolemia, unspecified: Secondary | ICD-10-CM | POA: Diagnosis not present

## 2021-04-14 DIAGNOSIS — I129 Hypertensive chronic kidney disease with stage 1 through stage 4 chronic kidney disease, or unspecified chronic kidney disease: Secondary | ICD-10-CM | POA: Diagnosis not present

## 2021-04-14 DIAGNOSIS — E118 Type 2 diabetes mellitus with unspecified complications: Secondary | ICD-10-CM | POA: Diagnosis not present

## 2021-04-14 DIAGNOSIS — E039 Hypothyroidism, unspecified: Secondary | ICD-10-CM | POA: Diagnosis not present

## 2021-04-14 DIAGNOSIS — N2581 Secondary hyperparathyroidism of renal origin: Secondary | ICD-10-CM | POA: Diagnosis not present

## 2021-05-30 ENCOUNTER — Other Ambulatory Visit: Payer: Self-pay | Admitting: Internal Medicine

## 2021-05-30 DIAGNOSIS — Z1231 Encounter for screening mammogram for malignant neoplasm of breast: Secondary | ICD-10-CM

## 2021-07-08 ENCOUNTER — Ambulatory Visit
Admission: RE | Admit: 2021-07-08 | Discharge: 2021-07-08 | Disposition: A | Discharge status: Home / Self Care | Payer: PPO | Source: Ambulatory Visit | Attending: Internal Medicine | Admitting: Internal Medicine

## 2021-07-08 DIAGNOSIS — Z1231 Encounter for screening mammogram for malignant neoplasm of breast: Secondary | ICD-10-CM | POA: Insufficient documentation

## 2021-08-01 DIAGNOSIS — H25813 Combined forms of age-related cataract, bilateral: Secondary | ICD-10-CM | POA: Diagnosis not present

## 2021-10-12 DIAGNOSIS — I129 Hypertensive chronic kidney disease with stage 1 through stage 4 chronic kidney disease, or unspecified chronic kidney disease: Secondary | ICD-10-CM | POA: Diagnosis not present

## 2021-10-12 DIAGNOSIS — E7800 Pure hypercholesterolemia, unspecified: Secondary | ICD-10-CM | POA: Diagnosis not present

## 2021-10-12 DIAGNOSIS — N183 Chronic kidney disease, stage 3 unspecified: Secondary | ICD-10-CM | POA: Diagnosis not present

## 2021-10-12 DIAGNOSIS — E039 Hypothyroidism, unspecified: Secondary | ICD-10-CM | POA: Diagnosis not present

## 2021-10-12 DIAGNOSIS — E118 Type 2 diabetes mellitus with unspecified complications: Secondary | ICD-10-CM | POA: Diagnosis not present

## 2021-10-18 DIAGNOSIS — E7800 Pure hypercholesterolemia, unspecified: Secondary | ICD-10-CM | POA: Diagnosis not present

## 2021-10-18 DIAGNOSIS — N183 Chronic kidney disease, stage 3 unspecified: Secondary | ICD-10-CM | POA: Diagnosis not present

## 2021-10-18 DIAGNOSIS — Z0000 Encounter for general adult medical examination without abnormal findings: Secondary | ICD-10-CM | POA: Diagnosis not present

## 2021-10-18 DIAGNOSIS — N2581 Secondary hyperparathyroidism of renal origin: Secondary | ICD-10-CM | POA: Diagnosis not present

## 2021-10-18 DIAGNOSIS — E118 Type 2 diabetes mellitus with unspecified complications: Secondary | ICD-10-CM | POA: Diagnosis not present

## 2021-10-18 DIAGNOSIS — I129 Hypertensive chronic kidney disease with stage 1 through stage 4 chronic kidney disease, or unspecified chronic kidney disease: Secondary | ICD-10-CM | POA: Diagnosis not present

## 2021-10-18 DIAGNOSIS — E039 Hypothyroidism, unspecified: Secondary | ICD-10-CM | POA: Diagnosis not present

## 2021-11-22 DIAGNOSIS — J069 Acute upper respiratory infection, unspecified: Secondary | ICD-10-CM | POA: Diagnosis not present

## 2021-12-16 ENCOUNTER — Other Ambulatory Visit: Payer: Self-pay

## 2021-12-16 ENCOUNTER — Emergency Department: Payer: PPO

## 2021-12-16 ENCOUNTER — Emergency Department
Admission: EM | Admit: 2021-12-16 | Discharge: 2021-12-16 | Disposition: A | Discharge status: Home / Self Care | Payer: PPO | Attending: Emergency Medicine | Admitting: Emergency Medicine

## 2021-12-16 DIAGNOSIS — S32592A Other specified fracture of left pubis, initial encounter for closed fracture: Secondary | ICD-10-CM | POA: Insufficient documentation

## 2021-12-16 DIAGNOSIS — W010XXA Fall on same level from slipping, tripping and stumbling without subsequent striking against object, initial encounter: Secondary | ICD-10-CM | POA: Insufficient documentation

## 2021-12-16 DIAGNOSIS — G319 Degenerative disease of nervous system, unspecified: Secondary | ICD-10-CM | POA: Insufficient documentation

## 2021-12-16 DIAGNOSIS — S32512A Fracture of superior rim of left pubis, initial encounter for closed fracture: Secondary | ICD-10-CM | POA: Insufficient documentation

## 2021-12-16 DIAGNOSIS — I10 Essential (primary) hypertension: Secondary | ICD-10-CM | POA: Diagnosis not present

## 2021-12-16 DIAGNOSIS — W19XXXA Unspecified fall, initial encounter: Secondary | ICD-10-CM | POA: Diagnosis not present

## 2021-12-16 DIAGNOSIS — M4317 Spondylolisthesis, lumbosacral region: Secondary | ICD-10-CM | POA: Diagnosis not present

## 2021-12-16 DIAGNOSIS — I739 Peripheral vascular disease, unspecified: Secondary | ICD-10-CM | POA: Diagnosis not present

## 2021-12-16 DIAGNOSIS — I6789 Other cerebrovascular disease: Secondary | ICD-10-CM | POA: Diagnosis not present

## 2021-12-16 DIAGNOSIS — S79912A Unspecified injury of left hip, initial encounter: Secondary | ICD-10-CM | POA: Diagnosis not present

## 2021-12-16 DIAGNOSIS — M47816 Spondylosis without myelopathy or radiculopathy, lumbar region: Secondary | ICD-10-CM | POA: Diagnosis not present

## 2021-12-16 DIAGNOSIS — M25552 Pain in left hip: Secondary | ICD-10-CM | POA: Diagnosis not present

## 2021-12-16 DIAGNOSIS — S0990XA Unspecified injury of head, initial encounter: Secondary | ICD-10-CM | POA: Diagnosis not present

## 2021-12-16 DIAGNOSIS — S37899A Unspecified injury of other urinary and pelvic organ, initial encounter: Secondary | ICD-10-CM | POA: Diagnosis present

## 2021-12-16 DIAGNOSIS — S32502A Unspecified fracture of left pubis, initial encounter for closed fracture: Secondary | ICD-10-CM | POA: Diagnosis not present

## 2021-12-16 NOTE — ED Provider Notes (Signed)
[facility] Provider Note    Event Date/Time   First MD Initiated Contact with Patient 12/16/21 1926     (approximate)   History   Fall   HPI  Michele Jordan is a 88 y.o. female with history of hypertension, arthritis, hyperlipidemia presents emergency department after a fall.  Patient states that her daughter's been ill and was unstable and fell into her causing her to fall back.  Patient landed on her bottom and left side.  States it hurts to lift her left leg.  No numbness or tingling.  No head injury.      Physical Exam   Triage Vital Signs: ED Triage Vitals  Enc Vitals Group     BP 12/16/21 1845 (!) 154/60     Pulse Rate 12/16/21 1845 71     Resp 12/16/21 1845 17     Temp 12/16/21 1845 98.8 F (37.1 C)     Temp src --      SpO2 12/16/21 1845 96 %     Weight --      Height --      Head Circumference --      Peak Flow --      Pain Score 12/16/21 1840 6     Pain Loc --      Pain Edu? --      Excl. in [place]? --     Most recent vital signs: Vitals:   12/16/21 1845  BP: (!) 154/60  Pulse: 71  Resp: 17  Temp: 98.8 F (37.1 C)  SpO2: 96%     General: Awake, no distress.   CV:  Good peripheral perfusion. regular rate and  rhythm Resp:  Normal effort.  Abd:  No distention.   Other:  Left hip is tender to palpation, shortening of the left leg but this is normal for the patient.,  States she has to wear a lift in her shoe, pain is reproduced with lifting of the left leg   ED Results / Procedures / Treatments   Labs (all labs ordered are listed, but only abnormal results are displayed) Labs Reviewed - No data to display   EKG     RADIOLOGY X-ray of the left hip, CT of the pelvis    PROCEDURES:   Procedures   MEDICATIONS ORDERED IN ED: Medications - No data to display   IMPRESSION / MDM / [place] / ED COURSE  I reviewed the triage vital signs and the nursing notes.                               Differential diagnosis includes, but is not limited to, fall, contusion, fracture, sprain, subdural  Patient's presentation is most consistent with acute presentation with potential threat to life or bodily function.   Due to the patient's age and recent fall we will do CT of the head   X-ray of the left hip shows a superior and inferior rami fracture  CT of the pelvis ordered to ensure there is not an occult fracture of the left hip   CT of the head independently reviewed and interpreted by me as being negative for any acute abnormality  CT of the pelvis independently reviewed and interpreted by me as showing the acute inferior and superior rami fracture  Consult orthopedics.  Dr.Poggi states to bear weight as tolerated.  She should use a walker.  Follow-up in his  office in 1 week,  I did explain everything to the patient, her son, her grandson who are all aware that she should only bear weight as tolerated.  If she is worsening they will need to bring her back to the emergency department.  I did explain to them that she should only be up doing a few activities and not taking care of everyone at her home.  I will contact social work for consult for home health aide to help her with a bath and other activities in the home.  Her husband is debilitated and she has a daughter that is extremely sick that is also staying with them.  The patient has been her sole caregiver.  The patient was discharged in stable condition in the care of her family.  She was given a prescription for DME walker.  She is to take Tylenol for pain as needed   FINAL CLINICAL IMPRESSION(S) / ED DIAGNOSES   Final diagnoses:  Closed fracture of superior ramus of left pubis, initial encounter ([place])  Inferior pubic ramus fracture, left, closed, initial encounter [facility])     Rx / DC Orders   ED Discharge Orders          Ordered    For home use only DME 4 wheeled rolling walker with seat        12/16/21 2132              Note:  This document was prepared using Dragon voice recognition software and may include unintentional dictation errors.    Versie Starks, PA-C 12/16/21 2138    Harvest Dark, MD 12/16/21 [phone]

## 2021-12-16 NOTE — Discharge Instructions (Addendum)
Follow-up with Dr.Poggi in 1 week for a recheck.  Please call and make an appointment.  You will need to call them Monday morning when they are open. Tylenol for pain as needed every 6 hours. Ice would help with pain Bear weight as tolerated but use the walker A social worker will call you for home health aide Return to the emergency department if worsening

## 2021-12-16 NOTE — ED Triage Notes (Signed)
Pt comes via EMs from home with c/o fall. Pt states her daughter tripped and fell onto her. The pt states pain to left thigh and hip. Pt able to bear weight but states pain to lift foot. No loc or hitting head. VSS

## 2021-12-26 DIAGNOSIS — S32592A Other specified fracture of left pubis, initial encounter for closed fracture: Secondary | ICD-10-CM | POA: Diagnosis not present

## 2021-12-29 DIAGNOSIS — Z9181 History of falling: Secondary | ICD-10-CM | POA: Diagnosis not present

## 2021-12-29 DIAGNOSIS — Z9049 Acquired absence of other specified parts of digestive tract: Secondary | ICD-10-CM | POA: Diagnosis not present

## 2021-12-29 DIAGNOSIS — N200 Calculus of kidney: Secondary | ICD-10-CM | POA: Diagnosis not present

## 2021-12-29 DIAGNOSIS — E039 Hypothyroidism, unspecified: Secondary | ICD-10-CM | POA: Diagnosis not present

## 2021-12-29 DIAGNOSIS — E785 Hyperlipidemia, unspecified: Secondary | ICD-10-CM | POA: Diagnosis not present

## 2021-12-29 DIAGNOSIS — R739 Hyperglycemia, unspecified: Secondary | ICD-10-CM | POA: Diagnosis not present

## 2021-12-29 DIAGNOSIS — J309 Allergic rhinitis, unspecified: Secondary | ICD-10-CM | POA: Diagnosis not present

## 2021-12-29 DIAGNOSIS — I129 Hypertensive chronic kidney disease with stage 1 through stage 4 chronic kidney disease, or unspecified chronic kidney disease: Secondary | ICD-10-CM | POA: Diagnosis not present

## 2021-12-29 DIAGNOSIS — Z79899 Other long term (current) drug therapy: Secondary | ICD-10-CM | POA: Diagnosis not present

## 2021-12-29 DIAGNOSIS — N189 Chronic kidney disease, unspecified: Secondary | ICD-10-CM | POA: Diagnosis not present

## 2021-12-29 DIAGNOSIS — M80052D Age-related osteoporosis with current pathological fracture, left femur, subsequent encounter for fracture with routine healing: Secondary | ICD-10-CM | POA: Diagnosis not present

## 2021-12-29 DIAGNOSIS — M722 Plantar fascial fibromatosis: Secondary | ICD-10-CM | POA: Diagnosis not present

## 2021-12-29 DIAGNOSIS — M1990 Unspecified osteoarthritis, unspecified site: Secondary | ICD-10-CM | POA: Diagnosis not present

## 2021-12-29 DIAGNOSIS — K219 Gastro-esophageal reflux disease without esophagitis: Secondary | ICD-10-CM | POA: Diagnosis not present

## 2022-01-16 DIAGNOSIS — E039 Hypothyroidism, unspecified: Secondary | ICD-10-CM | POA: Diagnosis not present

## 2022-01-16 DIAGNOSIS — K219 Gastro-esophageal reflux disease without esophagitis: Secondary | ICD-10-CM | POA: Diagnosis not present

## 2022-01-16 DIAGNOSIS — M722 Plantar fascial fibromatosis: Secondary | ICD-10-CM | POA: Diagnosis not present

## 2022-01-16 DIAGNOSIS — M80052D Age-related osteoporosis with current pathological fracture, left femur, subsequent encounter for fracture with routine healing: Secondary | ICD-10-CM | POA: Diagnosis not present

## 2022-01-16 DIAGNOSIS — R739 Hyperglycemia, unspecified: Secondary | ICD-10-CM | POA: Diagnosis not present

## 2022-01-16 DIAGNOSIS — E785 Hyperlipidemia, unspecified: Secondary | ICD-10-CM | POA: Diagnosis not present

## 2022-01-16 DIAGNOSIS — J309 Allergic rhinitis, unspecified: Secondary | ICD-10-CM | POA: Diagnosis not present

## 2022-01-16 DIAGNOSIS — I129 Hypertensive chronic kidney disease with stage 1 through stage 4 chronic kidney disease, or unspecified chronic kidney disease: Secondary | ICD-10-CM | POA: Diagnosis not present

## 2022-01-16 DIAGNOSIS — Z9049 Acquired absence of other specified parts of digestive tract: Secondary | ICD-10-CM | POA: Diagnosis not present

## 2022-01-16 DIAGNOSIS — Z9181 History of falling: Secondary | ICD-10-CM | POA: Diagnosis not present

## 2022-01-16 DIAGNOSIS — M1990 Unspecified osteoarthritis, unspecified site: Secondary | ICD-10-CM | POA: Diagnosis not present

## 2022-01-16 DIAGNOSIS — N189 Chronic kidney disease, unspecified: Secondary | ICD-10-CM | POA: Diagnosis not present

## 2022-01-16 DIAGNOSIS — N200 Calculus of kidney: Secondary | ICD-10-CM | POA: Diagnosis not present

## 2022-01-16 DIAGNOSIS — Z79899 Other long term (current) drug therapy: Secondary | ICD-10-CM | POA: Diagnosis not present

## 2022-01-18 DIAGNOSIS — Z9049 Acquired absence of other specified parts of digestive tract: Secondary | ICD-10-CM | POA: Diagnosis not present

## 2022-01-18 DIAGNOSIS — Z9181 History of falling: Secondary | ICD-10-CM | POA: Diagnosis not present

## 2022-01-18 DIAGNOSIS — N189 Chronic kidney disease, unspecified: Secondary | ICD-10-CM | POA: Diagnosis not present

## 2022-01-18 DIAGNOSIS — I129 Hypertensive chronic kidney disease with stage 1 through stage 4 chronic kidney disease, or unspecified chronic kidney disease: Secondary | ICD-10-CM | POA: Diagnosis not present

## 2022-01-18 DIAGNOSIS — M80052D Age-related osteoporosis with current pathological fracture, left femur, subsequent encounter for fracture with routine healing: Secondary | ICD-10-CM | POA: Diagnosis not present

## 2022-01-18 DIAGNOSIS — R739 Hyperglycemia, unspecified: Secondary | ICD-10-CM | POA: Diagnosis not present

## 2022-01-18 DIAGNOSIS — Z79899 Other long term (current) drug therapy: Secondary | ICD-10-CM | POA: Diagnosis not present

## 2022-01-18 DIAGNOSIS — E785 Hyperlipidemia, unspecified: Secondary | ICD-10-CM | POA: Diagnosis not present

## 2022-01-18 DIAGNOSIS — M1990 Unspecified osteoarthritis, unspecified site: Secondary | ICD-10-CM | POA: Diagnosis not present

## 2022-01-18 DIAGNOSIS — N200 Calculus of kidney: Secondary | ICD-10-CM | POA: Diagnosis not present

## 2022-01-18 DIAGNOSIS — E039 Hypothyroidism, unspecified: Secondary | ICD-10-CM | POA: Diagnosis not present

## 2022-01-18 DIAGNOSIS — K219 Gastro-esophageal reflux disease without esophagitis: Secondary | ICD-10-CM | POA: Diagnosis not present

## 2022-01-18 DIAGNOSIS — M722 Plantar fascial fibromatosis: Secondary | ICD-10-CM | POA: Diagnosis not present

## 2022-01-18 DIAGNOSIS — J309 Allergic rhinitis, unspecified: Secondary | ICD-10-CM | POA: Diagnosis not present

## 2022-01-24 DIAGNOSIS — M722 Plantar fascial fibromatosis: Secondary | ICD-10-CM | POA: Diagnosis not present

## 2022-01-24 DIAGNOSIS — K219 Gastro-esophageal reflux disease without esophagitis: Secondary | ICD-10-CM | POA: Diagnosis not present

## 2022-01-24 DIAGNOSIS — N200 Calculus of kidney: Secondary | ICD-10-CM | POA: Diagnosis not present

## 2022-01-24 DIAGNOSIS — Z9049 Acquired absence of other specified parts of digestive tract: Secondary | ICD-10-CM | POA: Diagnosis not present

## 2022-01-24 DIAGNOSIS — M80052D Age-related osteoporosis with current pathological fracture, left femur, subsequent encounter for fracture with routine healing: Secondary | ICD-10-CM | POA: Diagnosis not present

## 2022-01-24 DIAGNOSIS — N189 Chronic kidney disease, unspecified: Secondary | ICD-10-CM | POA: Diagnosis not present

## 2022-01-24 DIAGNOSIS — Z79899 Other long term (current) drug therapy: Secondary | ICD-10-CM | POA: Diagnosis not present

## 2022-01-24 DIAGNOSIS — E785 Hyperlipidemia, unspecified: Secondary | ICD-10-CM | POA: Diagnosis not present

## 2022-01-24 DIAGNOSIS — Z9181 History of falling: Secondary | ICD-10-CM | POA: Diagnosis not present

## 2022-01-24 DIAGNOSIS — I129 Hypertensive chronic kidney disease with stage 1 through stage 4 chronic kidney disease, or unspecified chronic kidney disease: Secondary | ICD-10-CM | POA: Diagnosis not present

## 2022-01-24 DIAGNOSIS — M1990 Unspecified osteoarthritis, unspecified site: Secondary | ICD-10-CM | POA: Diagnosis not present

## 2022-01-24 DIAGNOSIS — J309 Allergic rhinitis, unspecified: Secondary | ICD-10-CM | POA: Diagnosis not present

## 2022-01-24 DIAGNOSIS — R739 Hyperglycemia, unspecified: Secondary | ICD-10-CM | POA: Diagnosis not present

## 2022-01-24 DIAGNOSIS — E039 Hypothyroidism, unspecified: Secondary | ICD-10-CM | POA: Diagnosis not present

## 2022-01-25 DIAGNOSIS — S32592D Other specified fracture of left pubis, subsequent encounter for fracture with routine healing: Secondary | ICD-10-CM | POA: Diagnosis not present

## 2022-02-01 DIAGNOSIS — J309 Allergic rhinitis, unspecified: Secondary | ICD-10-CM | POA: Diagnosis not present

## 2022-02-01 DIAGNOSIS — M722 Plantar fascial fibromatosis: Secondary | ICD-10-CM | POA: Diagnosis not present

## 2022-02-01 DIAGNOSIS — M80052D Age-related osteoporosis with current pathological fracture, left femur, subsequent encounter for fracture with routine healing: Secondary | ICD-10-CM | POA: Diagnosis not present

## 2022-02-01 DIAGNOSIS — E039 Hypothyroidism, unspecified: Secondary | ICD-10-CM | POA: Diagnosis not present

## 2022-02-01 DIAGNOSIS — R739 Hyperglycemia, unspecified: Secondary | ICD-10-CM | POA: Diagnosis not present

## 2022-02-01 DIAGNOSIS — N200 Calculus of kidney: Secondary | ICD-10-CM | POA: Diagnosis not present

## 2022-02-01 DIAGNOSIS — Z79899 Other long term (current) drug therapy: Secondary | ICD-10-CM | POA: Diagnosis not present

## 2022-02-01 DIAGNOSIS — K219 Gastro-esophageal reflux disease without esophagitis: Secondary | ICD-10-CM | POA: Diagnosis not present

## 2022-02-01 DIAGNOSIS — I129 Hypertensive chronic kidney disease with stage 1 through stage 4 chronic kidney disease, or unspecified chronic kidney disease: Secondary | ICD-10-CM | POA: Diagnosis not present

## 2022-02-01 DIAGNOSIS — M1990 Unspecified osteoarthritis, unspecified site: Secondary | ICD-10-CM | POA: Diagnosis not present

## 2022-02-01 DIAGNOSIS — E785 Hyperlipidemia, unspecified: Secondary | ICD-10-CM | POA: Diagnosis not present

## 2022-02-01 DIAGNOSIS — Z9049 Acquired absence of other specified parts of digestive tract: Secondary | ICD-10-CM | POA: Diagnosis not present

## 2022-02-01 DIAGNOSIS — Z9181 History of falling: Secondary | ICD-10-CM | POA: Diagnosis not present

## 2022-02-01 DIAGNOSIS — N189 Chronic kidney disease, unspecified: Secondary | ICD-10-CM | POA: Diagnosis not present

## 2022-02-08 DIAGNOSIS — Z9181 History of falling: Secondary | ICD-10-CM | POA: Diagnosis not present

## 2022-02-08 DIAGNOSIS — R739 Hyperglycemia, unspecified: Secondary | ICD-10-CM | POA: Diagnosis not present

## 2022-02-08 DIAGNOSIS — N189 Chronic kidney disease, unspecified: Secondary | ICD-10-CM | POA: Diagnosis not present

## 2022-02-08 DIAGNOSIS — N200 Calculus of kidney: Secondary | ICD-10-CM | POA: Diagnosis not present

## 2022-02-08 DIAGNOSIS — I129 Hypertensive chronic kidney disease with stage 1 through stage 4 chronic kidney disease, or unspecified chronic kidney disease: Secondary | ICD-10-CM | POA: Diagnosis not present

## 2022-02-08 DIAGNOSIS — E785 Hyperlipidemia, unspecified: Secondary | ICD-10-CM | POA: Diagnosis not present

## 2022-02-08 DIAGNOSIS — Z79899 Other long term (current) drug therapy: Secondary | ICD-10-CM | POA: Diagnosis not present

## 2022-02-08 DIAGNOSIS — J309 Allergic rhinitis, unspecified: Secondary | ICD-10-CM | POA: Diagnosis not present

## 2022-02-08 DIAGNOSIS — Z9049 Acquired absence of other specified parts of digestive tract: Secondary | ICD-10-CM | POA: Diagnosis not present

## 2022-02-08 DIAGNOSIS — M80052D Age-related osteoporosis with current pathological fracture, left femur, subsequent encounter for fracture with routine healing: Secondary | ICD-10-CM | POA: Diagnosis not present

## 2022-02-08 DIAGNOSIS — E039 Hypothyroidism, unspecified: Secondary | ICD-10-CM | POA: Diagnosis not present

## 2022-02-08 DIAGNOSIS — M1990 Unspecified osteoarthritis, unspecified site: Secondary | ICD-10-CM | POA: Diagnosis not present

## 2022-02-08 DIAGNOSIS — M722 Plantar fascial fibromatosis: Secondary | ICD-10-CM | POA: Diagnosis not present

## 2022-02-08 DIAGNOSIS — K219 Gastro-esophageal reflux disease without esophagitis: Secondary | ICD-10-CM | POA: Diagnosis not present

## 2022-02-14 DIAGNOSIS — E785 Hyperlipidemia, unspecified: Secondary | ICD-10-CM | POA: Diagnosis not present

## 2022-02-14 DIAGNOSIS — N189 Chronic kidney disease, unspecified: Secondary | ICD-10-CM | POA: Diagnosis not present

## 2022-02-14 DIAGNOSIS — I129 Hypertensive chronic kidney disease with stage 1 through stage 4 chronic kidney disease, or unspecified chronic kidney disease: Secondary | ICD-10-CM | POA: Diagnosis not present

## 2022-02-14 DIAGNOSIS — Z79899 Other long term (current) drug therapy: Secondary | ICD-10-CM | POA: Diagnosis not present

## 2022-02-14 DIAGNOSIS — M722 Plantar fascial fibromatosis: Secondary | ICD-10-CM | POA: Diagnosis not present

## 2022-02-14 DIAGNOSIS — Z9049 Acquired absence of other specified parts of digestive tract: Secondary | ICD-10-CM | POA: Diagnosis not present

## 2022-02-14 DIAGNOSIS — K219 Gastro-esophageal reflux disease without esophagitis: Secondary | ICD-10-CM | POA: Diagnosis not present

## 2022-02-14 DIAGNOSIS — N200 Calculus of kidney: Secondary | ICD-10-CM | POA: Diagnosis not present

## 2022-02-14 DIAGNOSIS — E039 Hypothyroidism, unspecified: Secondary | ICD-10-CM | POA: Diagnosis not present

## 2022-02-14 DIAGNOSIS — J309 Allergic rhinitis, unspecified: Secondary | ICD-10-CM | POA: Diagnosis not present

## 2022-02-14 DIAGNOSIS — M80052D Age-related osteoporosis with current pathological fracture, left femur, subsequent encounter for fracture with routine healing: Secondary | ICD-10-CM | POA: Diagnosis not present

## 2022-02-14 DIAGNOSIS — M1990 Unspecified osteoarthritis, unspecified site: Secondary | ICD-10-CM | POA: Diagnosis not present

## 2022-02-14 DIAGNOSIS — R739 Hyperglycemia, unspecified: Secondary | ICD-10-CM | POA: Diagnosis not present

## 2022-02-14 DIAGNOSIS — Z9181 History of falling: Secondary | ICD-10-CM | POA: Diagnosis not present

## 2022-02-21 DIAGNOSIS — E785 Hyperlipidemia, unspecified: Secondary | ICD-10-CM | POA: Diagnosis not present

## 2022-02-21 DIAGNOSIS — Z9181 History of falling: Secondary | ICD-10-CM | POA: Diagnosis not present

## 2022-02-21 DIAGNOSIS — M722 Plantar fascial fibromatosis: Secondary | ICD-10-CM | POA: Diagnosis not present

## 2022-02-21 DIAGNOSIS — Z9049 Acquired absence of other specified parts of digestive tract: Secondary | ICD-10-CM | POA: Diagnosis not present

## 2022-02-21 DIAGNOSIS — N200 Calculus of kidney: Secondary | ICD-10-CM | POA: Diagnosis not present

## 2022-02-21 DIAGNOSIS — N189 Chronic kidney disease, unspecified: Secondary | ICD-10-CM | POA: Diagnosis not present

## 2022-02-21 DIAGNOSIS — M80052D Age-related osteoporosis with current pathological fracture, left femur, subsequent encounter for fracture with routine healing: Secondary | ICD-10-CM | POA: Diagnosis not present

## 2022-02-21 DIAGNOSIS — Z79899 Other long term (current) drug therapy: Secondary | ICD-10-CM | POA: Diagnosis not present

## 2022-02-21 DIAGNOSIS — M1990 Unspecified osteoarthritis, unspecified site: Secondary | ICD-10-CM | POA: Diagnosis not present

## 2022-02-21 DIAGNOSIS — E039 Hypothyroidism, unspecified: Secondary | ICD-10-CM | POA: Diagnosis not present

## 2022-02-21 DIAGNOSIS — I129 Hypertensive chronic kidney disease with stage 1 through stage 4 chronic kidney disease, or unspecified chronic kidney disease: Secondary | ICD-10-CM | POA: Diagnosis not present

## 2022-02-21 DIAGNOSIS — K219 Gastro-esophageal reflux disease without esophagitis: Secondary | ICD-10-CM | POA: Diagnosis not present

## 2022-02-21 DIAGNOSIS — R739 Hyperglycemia, unspecified: Secondary | ICD-10-CM | POA: Diagnosis not present

## 2022-02-21 DIAGNOSIS — J309 Allergic rhinitis, unspecified: Secondary | ICD-10-CM | POA: Diagnosis not present

## 2022-02-24 DIAGNOSIS — D225 Melanocytic nevi of trunk: Secondary | ICD-10-CM | POA: Diagnosis not present

## 2022-02-24 DIAGNOSIS — D2271 Melanocytic nevi of right lower limb, including hip: Secondary | ICD-10-CM | POA: Diagnosis not present

## 2022-02-24 DIAGNOSIS — D0439 Carcinoma in situ of skin of other parts of face: Secondary | ICD-10-CM | POA: Diagnosis not present

## 2022-02-24 DIAGNOSIS — D485 Neoplasm of uncertain behavior of skin: Secondary | ICD-10-CM | POA: Diagnosis not present

## 2022-02-24 DIAGNOSIS — D2261 Melanocytic nevi of right upper limb, including shoulder: Secondary | ICD-10-CM | POA: Diagnosis not present

## 2022-02-24 DIAGNOSIS — L538 Other specified erythematous conditions: Secondary | ICD-10-CM | POA: Diagnosis not present

## 2022-02-24 DIAGNOSIS — L298 Other pruritus: Secondary | ICD-10-CM | POA: Diagnosis not present

## 2022-02-24 DIAGNOSIS — L820 Inflamed seborrheic keratosis: Secondary | ICD-10-CM | POA: Diagnosis not present

## 2022-02-24 DIAGNOSIS — L570 Actinic keratosis: Secondary | ICD-10-CM | POA: Diagnosis not present

## 2022-02-24 DIAGNOSIS — D2272 Melanocytic nevi of left lower limb, including hip: Secondary | ICD-10-CM | POA: Diagnosis not present

## 2022-02-24 DIAGNOSIS — D2262 Melanocytic nevi of left upper limb, including shoulder: Secondary | ICD-10-CM | POA: Diagnosis not present

## 2022-04-13 DIAGNOSIS — N183 Chronic kidney disease, stage 3 unspecified: Secondary | ICD-10-CM | POA: Diagnosis not present

## 2022-04-13 DIAGNOSIS — E039 Hypothyroidism, unspecified: Secondary | ICD-10-CM | POA: Diagnosis not present

## 2022-04-13 DIAGNOSIS — E7800 Pure hypercholesterolemia, unspecified: Secondary | ICD-10-CM | POA: Diagnosis not present

## 2022-04-13 DIAGNOSIS — E118 Type 2 diabetes mellitus with unspecified complications: Secondary | ICD-10-CM | POA: Diagnosis not present

## 2022-04-13 DIAGNOSIS — I129 Hypertensive chronic kidney disease with stage 1 through stage 4 chronic kidney disease, or unspecified chronic kidney disease: Secondary | ICD-10-CM | POA: Diagnosis not present

## 2022-04-20 DIAGNOSIS — N183 Chronic kidney disease, stage 3 unspecified: Secondary | ICD-10-CM | POA: Diagnosis not present

## 2022-04-20 DIAGNOSIS — E118 Type 2 diabetes mellitus with unspecified complications: Secondary | ICD-10-CM | POA: Diagnosis not present

## 2022-04-20 DIAGNOSIS — E7800 Pure hypercholesterolemia, unspecified: Secondary | ICD-10-CM | POA: Diagnosis not present

## 2022-04-20 DIAGNOSIS — I129 Hypertensive chronic kidney disease with stage 1 through stage 4 chronic kidney disease, or unspecified chronic kidney disease: Secondary | ICD-10-CM | POA: Diagnosis not present

## 2022-04-20 DIAGNOSIS — E039 Hypothyroidism, unspecified: Secondary | ICD-10-CM | POA: Diagnosis not present

## 2022-04-26 DIAGNOSIS — D2339 Other benign neoplasm of skin of other parts of face: Secondary | ICD-10-CM | POA: Diagnosis not present

## 2022-04-26 DIAGNOSIS — D0439 Carcinoma in situ of skin of other parts of face: Secondary | ICD-10-CM | POA: Diagnosis not present

## 2022-04-26 DIAGNOSIS — C44729 Squamous cell carcinoma of skin of left lower limb, including hip: Secondary | ICD-10-CM | POA: Diagnosis not present

## 2022-04-26 DIAGNOSIS — D485 Neoplasm of uncertain behavior of skin: Secondary | ICD-10-CM | POA: Diagnosis not present

## 2022-04-26 DIAGNOSIS — L905 Scar conditions and fibrosis of skin: Secondary | ICD-10-CM | POA: Diagnosis not present

## 2022-06-05 ENCOUNTER — Other Ambulatory Visit: Payer: Self-pay | Admitting: Internal Medicine

## 2022-06-05 DIAGNOSIS — Z1231 Encounter for screening mammogram for malignant neoplasm of breast: Secondary | ICD-10-CM

## 2022-06-14 DIAGNOSIS — C44729 Squamous cell carcinoma of skin of left lower limb, including hip: Secondary | ICD-10-CM | POA: Diagnosis not present

## 2022-06-14 DIAGNOSIS — D2372 Other benign neoplasm of skin of left lower limb, including hip: Secondary | ICD-10-CM | POA: Diagnosis not present

## 2022-07-10 ENCOUNTER — Ambulatory Visit
Admission: RE | Admit: 2022-07-10 | Discharge: 2022-07-10 | Disposition: A | Discharge status: Home / Self Care | Payer: PPO | Source: Ambulatory Visit | Attending: Internal Medicine | Admitting: Internal Medicine

## 2022-07-10 DIAGNOSIS — Z1231 Encounter for screening mammogram for malignant neoplasm of breast: Secondary | ICD-10-CM | POA: Insufficient documentation

## 2022-07-13 ENCOUNTER — Other Ambulatory Visit: Payer: Self-pay | Admitting: Internal Medicine

## 2022-07-13 DIAGNOSIS — R921 Mammographic calcification found on diagnostic imaging of breast: Secondary | ICD-10-CM

## 2022-07-13 DIAGNOSIS — R928 Other abnormal and inconclusive findings on diagnostic imaging of breast: Secondary | ICD-10-CM

## 2022-07-14 DIAGNOSIS — R300 Dysuria: Secondary | ICD-10-CM | POA: Diagnosis not present

## 2022-07-14 DIAGNOSIS — R319 Hematuria, unspecified: Secondary | ICD-10-CM | POA: Diagnosis not present

## 2022-07-14 DIAGNOSIS — R8290 Unspecified abnormal findings in urine: Secondary | ICD-10-CM | POA: Diagnosis not present

## 2022-07-18 ENCOUNTER — Ambulatory Visit
Admission: RE | Admit: 2022-07-18 | Discharge: 2022-07-18 | Disposition: A | Discharge status: Home / Self Care | Payer: PPO | Source: Ambulatory Visit | Attending: Internal Medicine | Admitting: Internal Medicine

## 2022-07-18 DIAGNOSIS — R921 Mammographic calcification found on diagnostic imaging of breast: Secondary | ICD-10-CM | POA: Diagnosis not present

## 2022-07-18 DIAGNOSIS — R928 Other abnormal and inconclusive findings on diagnostic imaging of breast: Secondary | ICD-10-CM | POA: Diagnosis not present

## 2022-07-20 ENCOUNTER — Encounter: Payer: Self-pay | Admitting: Internal Medicine

## 2022-07-21 ENCOUNTER — Other Ambulatory Visit: Payer: Self-pay | Admitting: Internal Medicine

## 2022-07-21 DIAGNOSIS — R921 Mammographic calcification found on diagnostic imaging of breast: Secondary | ICD-10-CM

## 2022-07-28 ENCOUNTER — Other Ambulatory Visit: Payer: Self-pay | Admitting: Internal Medicine

## 2022-07-28 DIAGNOSIS — R921 Mammographic calcification found on diagnostic imaging of breast: Secondary | ICD-10-CM

## 2022-08-03 DIAGNOSIS — H25813 Combined forms of age-related cataract, bilateral: Secondary | ICD-10-CM | POA: Diagnosis not present

## 2022-08-03 IMAGING — MG MM DIGITAL SCREENING BILAT W/ TOMO AND CAD
6 of 10 series · 6 of 30 positions shown · non-contrast
Comparison: Previous exam(s).

CLINICAL DATA: Screening.

EXAM:
DIGITAL SCREENING BILATERAL MAMMOGRAM WITH TOMOSYNTHESIS AND CAD
TECHNIQUE: Bilateral screening digital craniocaudal and mediolateral oblique
mammograms were obtained. Bilateral screening digital breast
tomosynthesis was performed. The images were evaluated with
computer-aided detection.

[L CC synth-2D]
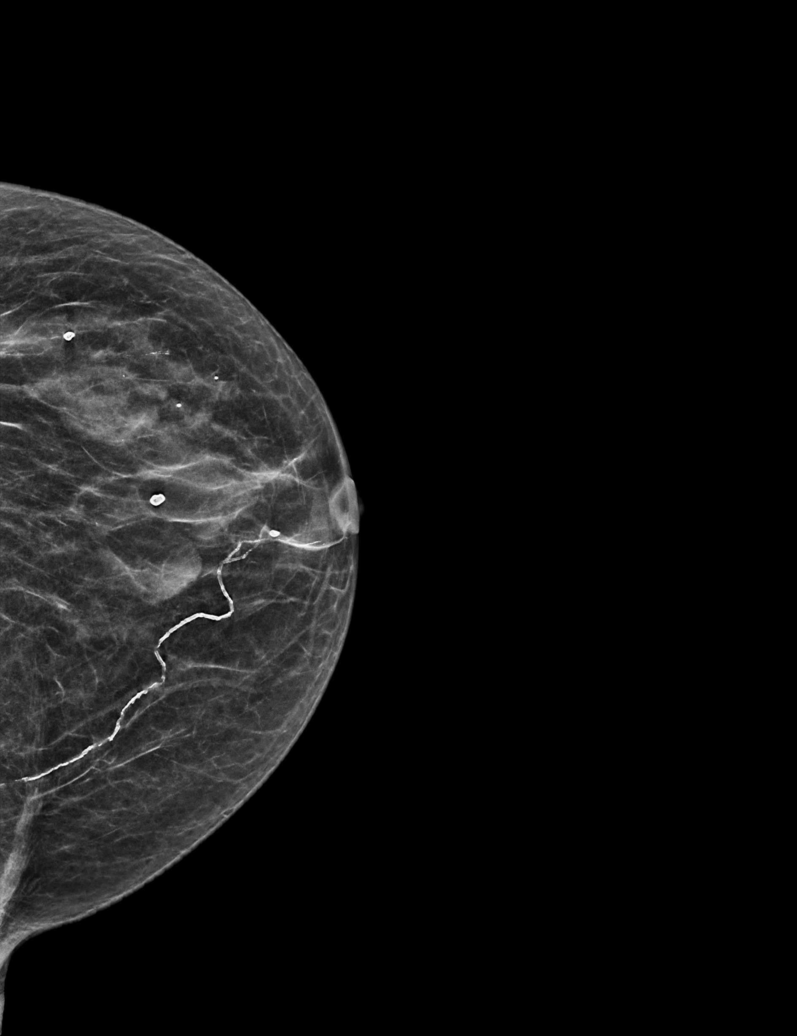

[R MLO synth-2D]
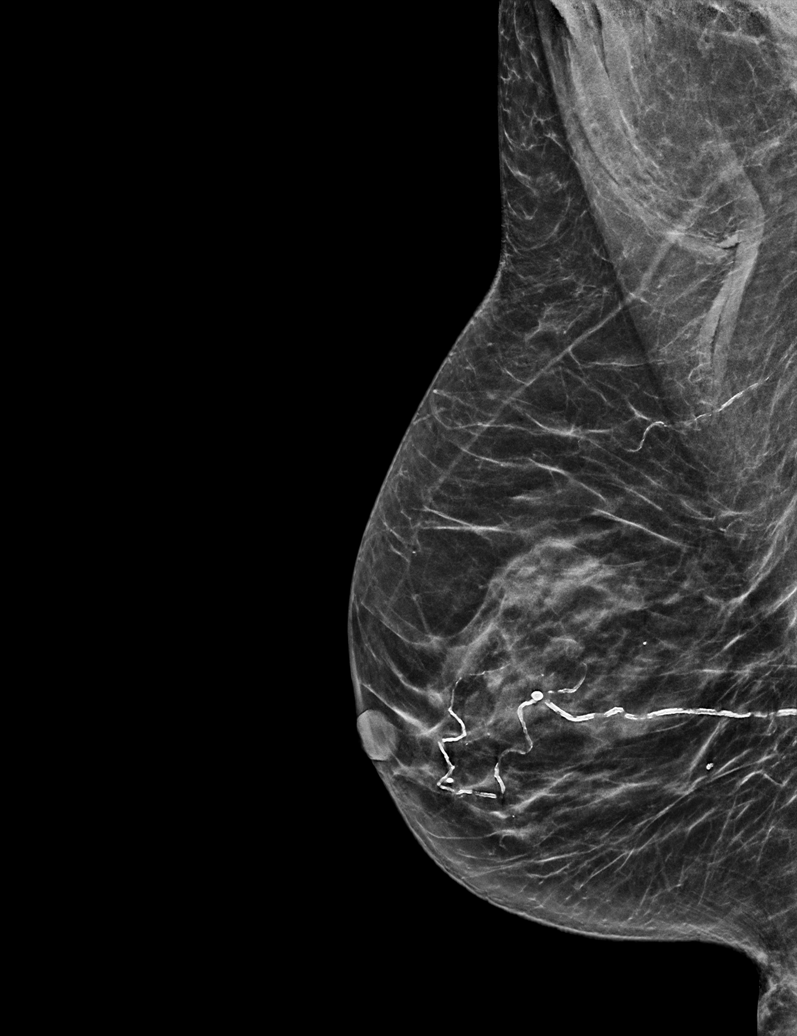

[L XCCL synth-2D]
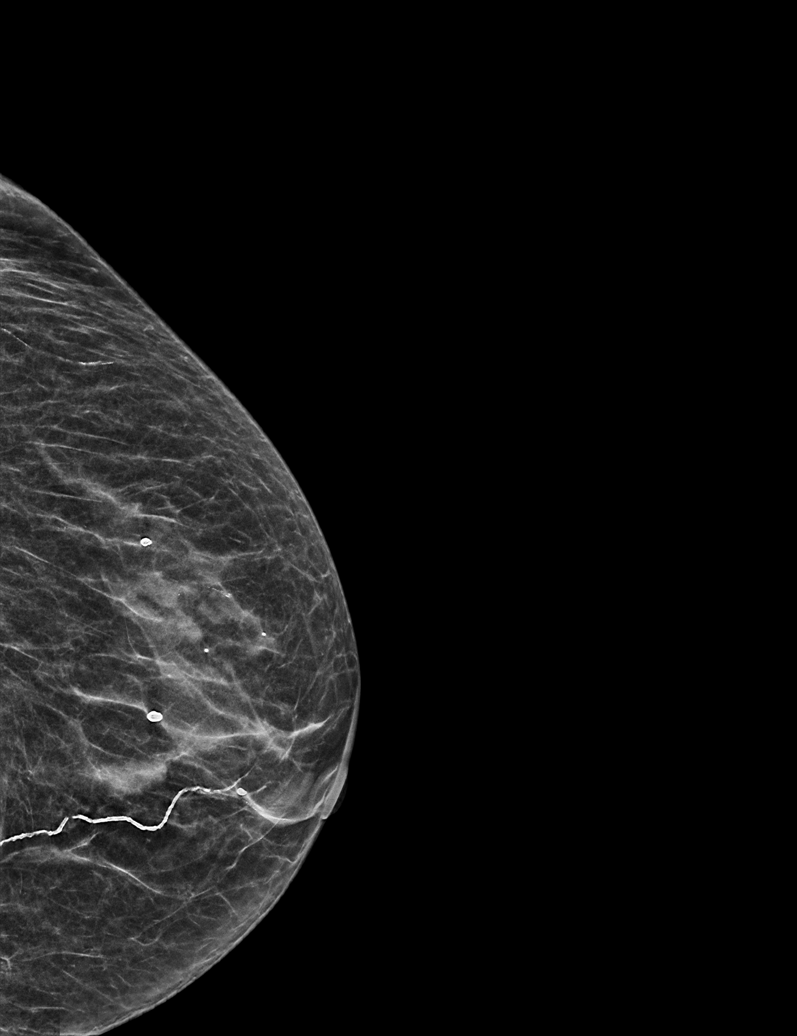

[R CC synth-2D]
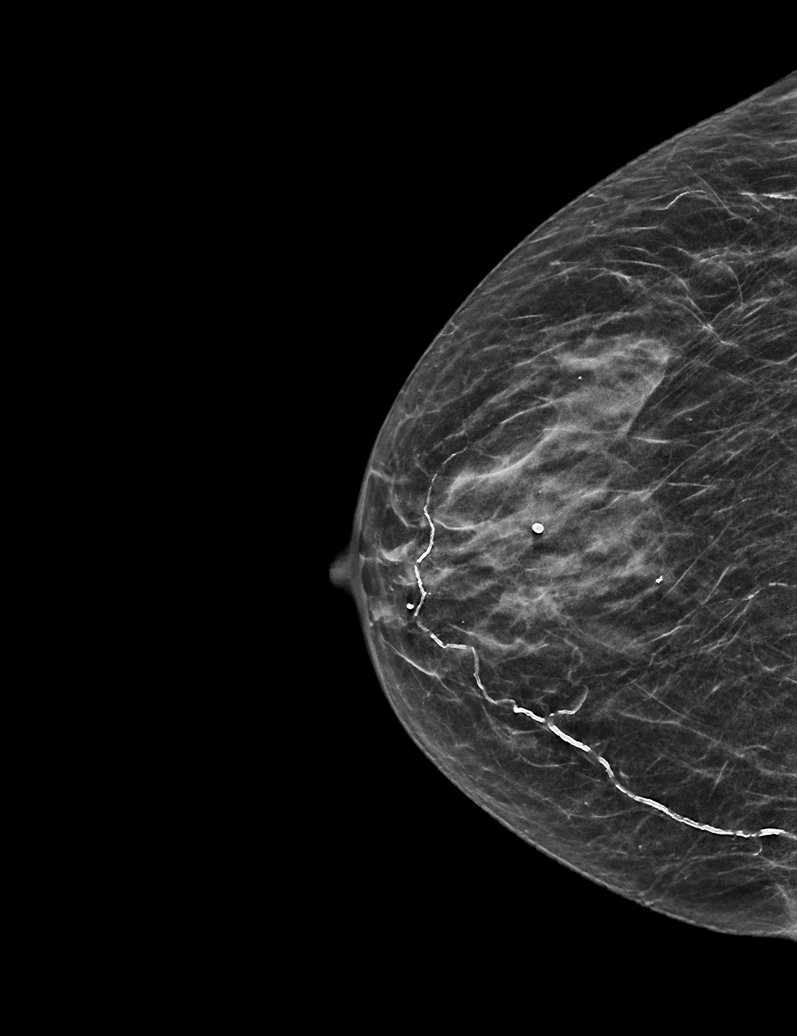

[L MLO synth-2D]
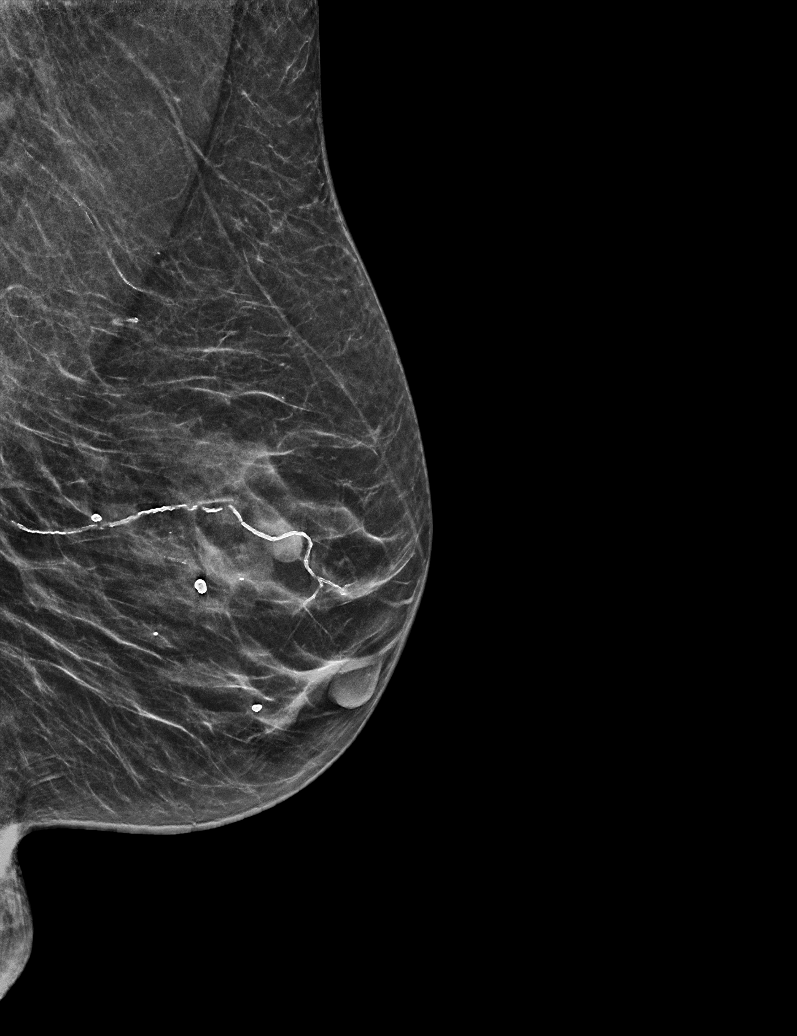

[L CC tomo · tomo slice 24/47.0]
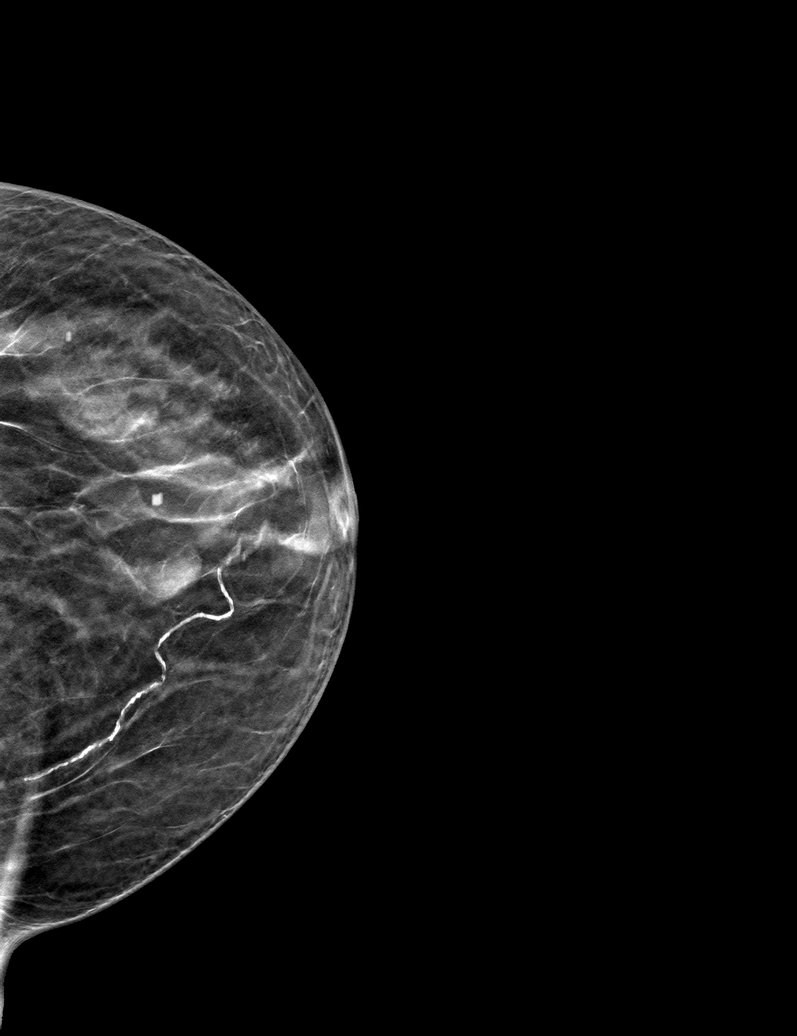

[6 of 30 positions shown; findings below may reference images not displayed]

ACR Breast Density Category c: The breast tissue is heterogeneously
dense, which may obscure small masses.
FINDINGS: There are no findings suspicious for malignancy.
IMPRESSION: No mammographic evidence of malignancy. A result letter of this
screening mammogram will be mailed directly to the patient.

RECOMMENDATION:
Screening mammogram in one year. (Code:Q3-W-BC3)

BI-RADS CATEGORY  1: Negative.

## 2022-10-11 DIAGNOSIS — E039 Hypothyroidism, unspecified: Secondary | ICD-10-CM | POA: Diagnosis not present

## 2022-10-11 DIAGNOSIS — E118 Type 2 diabetes mellitus with unspecified complications: Secondary | ICD-10-CM | POA: Diagnosis not present

## 2022-10-11 DIAGNOSIS — E7800 Pure hypercholesterolemia, unspecified: Secondary | ICD-10-CM | POA: Diagnosis not present

## 2022-10-11 DIAGNOSIS — N183 Chronic kidney disease, stage 3 unspecified: Secondary | ICD-10-CM | POA: Diagnosis not present

## 2022-10-11 DIAGNOSIS — I129 Hypertensive chronic kidney disease with stage 1 through stage 4 chronic kidney disease, or unspecified chronic kidney disease: Secondary | ICD-10-CM | POA: Diagnosis not present

## 2022-10-19 DIAGNOSIS — E039 Hypothyroidism, unspecified: Secondary | ICD-10-CM | POA: Diagnosis not present

## 2022-10-19 DIAGNOSIS — E118 Type 2 diabetes mellitus with unspecified complications: Secondary | ICD-10-CM | POA: Diagnosis not present

## 2022-10-19 DIAGNOSIS — E7800 Pure hypercholesterolemia, unspecified: Secondary | ICD-10-CM | POA: Diagnosis not present

## 2022-10-19 DIAGNOSIS — Z0000 Encounter for general adult medical examination without abnormal findings: Secondary | ICD-10-CM | POA: Diagnosis not present

## 2022-10-19 DIAGNOSIS — N183 Chronic kidney disease, stage 3 unspecified: Secondary | ICD-10-CM | POA: Diagnosis not present

## 2022-10-19 DIAGNOSIS — I129 Hypertensive chronic kidney disease with stage 1 through stage 4 chronic kidney disease, or unspecified chronic kidney disease: Secondary | ICD-10-CM | POA: Diagnosis not present

## 2022-11-20 ENCOUNTER — Telehealth: Payer: Self-pay

## 2022-11-20 NOTE — Telephone Encounter (Signed)
I called patient to schedule a new patient appointment.  Patient states her husband was transported to the hospital here last night.  Patient states she is very excited to have the opportunity to have Dr. Dale Lonepine as her new PCP.  Patient states she will call us back to schedule this appointment once everything settles down with her husband.

## 2022-11-20 NOTE — Telephone Encounter (Signed)
Noted  

## 2022-11-24 DIAGNOSIS — L570 Actinic keratosis: Secondary | ICD-10-CM | POA: Diagnosis not present

## 2022-11-24 DIAGNOSIS — L821 Other seborrheic keratosis: Secondary | ICD-10-CM | POA: Diagnosis not present

## 2023-01-18 ENCOUNTER — Ambulatory Visit
Admission: RE | Admit: 2023-01-18 | Discharge: 2023-01-18 | Disposition: A | Discharge status: Home / Self Care | Payer: PPO | Source: Ambulatory Visit | Attending: Internal Medicine | Admitting: Internal Medicine

## 2023-01-18 DIAGNOSIS — R92333 Mammographic heterogeneous density, bilateral breasts: Secondary | ICD-10-CM | POA: Diagnosis not present

## 2023-01-18 DIAGNOSIS — R921 Mammographic calcification found on diagnostic imaging of breast: Secondary | ICD-10-CM | POA: Insufficient documentation

## 2023-01-18 DIAGNOSIS — R928 Other abnormal and inconclusive findings on diagnostic imaging of breast: Secondary | ICD-10-CM | POA: Diagnosis not present

## 2023-01-31 ENCOUNTER — Encounter: Payer: Self-pay | Admitting: Internal Medicine

## 2023-01-31 ENCOUNTER — Ambulatory Visit (INDEPENDENT_AMBULATORY_CARE_PROVIDER_SITE_OTHER): Payer: PPO | Admitting: Internal Medicine

## 2023-01-31 VITALS — BP 136/72 | HR 71 | Temp 98.2°F | Resp 16 | Ht 60.0 in | Wt 114.8 lb

## 2023-01-31 DIAGNOSIS — R739 Hyperglycemia, unspecified: Secondary | ICD-10-CM | POA: Diagnosis not present

## 2023-01-31 DIAGNOSIS — M810 Age-related osteoporosis without current pathological fracture: Secondary | ICD-10-CM

## 2023-01-31 DIAGNOSIS — I10 Essential (primary) hypertension: Secondary | ICD-10-CM

## 2023-01-31 DIAGNOSIS — E7800 Pure hypercholesterolemia, unspecified: Secondary | ICD-10-CM

## 2023-01-31 DIAGNOSIS — N183 Chronic kidney disease, stage 3 unspecified: Secondary | ICD-10-CM | POA: Diagnosis not present

## 2023-01-31 NOTE — Progress Notes (Signed)
Subjective:    Patient ID: Michele Jordan, female    DOB: 1933/01/15, 89 y.o.   MRN: [id number]  Patient here for  Chief Complaint  Patient presents with   Establish Care    HPI Here to establish care.  History of hypertension, CKD, hypothyroidism, diabetes and hypercholesterolemia. Reports increased stress - family stress, specifically her daughter's health issues. Discussed.  She tries to stay active.  No chest pain or sob reported.  Eating.  Able to do her ADLs.  No abdominal pain or bowel change reported.    Past Medical History:  Diagnosis Date   Allergy    Arthritis    GERD (gastroesophageal reflux disease)    Hyperlipidemia    Hypertension    Personal history of colonic polyps    Thyroid disease    Past Surgical History:  Procedure Laterality Date   APPENDECTOMY  1950   BREAST EXCISIONAL BIOPSY Left 1980   neg   COLONOSCOPY  2011   Family History  Problem Relation Age of Onset   Early death Mother    Diabetes Mother    Cancer Mother    Heart disease Father    Early death Father    Heart attack Father    Diabetes Sister    Early death Brother    Hyperlipidemia Daughter    Depression Daughter    Asthma Daughter    Mental illness Daughter    Breast cancer Neg Hx    Social History   Socioeconomic History   Marital status: Married    Spouse name: Not on file   Number of children: Not on file   Years of education: Not on file   Highest education level: Not on file  Occupational History   Not on file  Tobacco Use   Smoking status: Never   Smokeless tobacco: Never  Substance and Sexual Activity   Alcohol use: No   Drug use: No   Sexual activity: Not on file  Other Topics Concern   Not on file  Social History Narrative   Not on file   Social Determinants of Health   Financial Resource Strain: Low Risk  (10/19/2022)   Received from [facility]   Overall Financial Resource Strain (CARDIA)    Difficulty of Paying Living  Expenses: Not hard at all  Food Insecurity: No Food Insecurity (10/19/2022)   Received from [facility]   Hunger Vital Sign    Worried About Running Out of Food in the Last Year: Never true    Ran Out of Food in the Last Year: Never true  Transportation Needs: No Transportation Needs (10/19/2022)   Received from [facility] - Transportation    In the past 12 months, has lack of transportation kept you from medical appointments or from getting medications?: No    Lack of Transportation (Non-Medical): No  Physical Activity: Not on file  Stress: Not on file  Social Connections: Not on file     Review of Systems  Constitutional:  Negative for appetite change and unexpected weight change.  HENT:  Negative for congestion and sinus pressure.   Respiratory:  Negative for cough, chest tightness and shortness of breath.   Cardiovascular:  Negative for chest pain and palpitations.  Gastrointestinal:  Negative for abdominal pain, diarrhea, nausea and vomiting.  Genitourinary:  Negative for difficulty urinating and dysuria.  Musculoskeletal:  Negative for joint swelling and myalgias.  Skin:  Negative for  color change and rash.  Neurological:  Negative for dizziness and headaches.  Psychiatric/Behavioral:  Negative for dysphoric mood.        Increased stress as outlined         Objective:     BP 136/72   Pulse 71   Temp 98.2 F (36.8 C)   Resp 16   Ht 5' (1.524 m)   Wt 114 lb 12.8 oz (52.1 kg)   SpO2 97%   BMI 22.42 kg/m  Wt Readings from Last 3 Encounters:  01/31/23 114 lb 12.8 oz (52.1 kg)  01/24/18 136 lb 4.8 oz (61.8 kg)  05/18/14 129 lb (58.5 kg)    Physical Exam Vitals reviewed.  Constitutional:      General: She is not in acute distress.    Appearance: Normal appearance.  HENT:     Head: Normocephalic and atraumatic.     Right Ear: External ear normal.     Left Ear: External ear normal.  Eyes:     General: No scleral  icterus.       Right eye: No discharge.        Left eye: No discharge.     Conjunctiva/sclera: Conjunctivae normal.  Neck:     Thyroid: No thyromegaly.  Cardiovascular:     Rate and Rhythm: Normal rate and regular rhythm.  Pulmonary:     Effort: No respiratory distress.     Breath sounds: Normal breath sounds. No wheezing.  Abdominal:     General: Bowel sounds are normal.     Palpations: Abdomen is soft.     Tenderness: There is no abdominal tenderness.  Musculoskeletal:        General: No swelling or tenderness.     Cervical back: Neck supple. No tenderness.  Lymphadenopathy:     Cervical: No cervical adenopathy.  Skin:    Findings: No erythema or rash.  Neurological:     Mental Status: She is alert.  Psychiatric:        Mood and Affect: Mood normal.        Behavior: Behavior normal.      Outpatient Encounter Medications as of 01/31/2023  Medication Sig   ASPIRIN 81 PO Take 1 tablet by mouth at bedtime.   famotidine (PEPCID) 20 MG tablet Take 20 mg by mouth daily.   levothyroxine (SYNTHROID, LEVOTHROID) 25 MCG tablet TAKE ONE TABLET BY MOUTH ONCE DAILY. TAKE ON AN EMPTY STOMACH WITH A GLASS OF WATER AT LEAST 30-60 MINUTES BEFORE BREAKFAST   lisinopril-hydrochlorothiazide (PRINZIDE,ZESTORETIC) 20-12.5 MG per tablet Take 1 tablet by mouth daily.   simvastatin (ZOCOR) 40 MG tablet Take 40 mg by mouth daily.   [DISCONTINUED] Ascorbic Acid (VITAMIN C PO) Take by mouth.   [DISCONTINUED] Cholecalciferol (VITAMIN D3) 2000 units TABS Take by mouth.   [DISCONTINUED] fluticasone (FLONASE) 50 MCG/ACT nasal spray Place into both nostrils daily.   [DISCONTINUED] Multiple Vitamin (MULTIVITAMIN) tablet Take 1 tablet by mouth daily.   [DISCONTINUED] traZODone (DESYREL) 50 MG tablet Take by mouth.   No facility-administered encounter medications on file as of 01/31/2023.     No results found for: "WBC", "HGB", "HCT", "PLT", "GLUCOSE", "CHOL", "TRIG", "HDL", "LDLDIRECT", "LDLCALC",  "ALT", "AST", "NA", "K", "CL", "CREATININE", "BUN", "CO2", "TSH", "PSA", "INR", "GLUF", "HGBA1C", "MICROALBUR"  MM 3D DIAGNOSTIC MAMMOGRAM UNILATERAL LEFT BREAST  Result Date: 01/18/2023 CLINICAL DATA:  First six-month follow-up of probably benign left breast calcifications. EXAM: DIGITAL DIAGNOSTIC UNILATERAL LEFT MAMMOGRAM WITH TOMOSYNTHESIS AND CAD TECHNIQUE: Left digital diagnostic mammography  and breast tomosynthesis was performed. The images were evaluated with computer-aided detection. COMPARISON:  Previous exam(s). ACR Breast Density Category c: The breasts are heterogeneously dense, which may obscure small masses. FINDINGS: Standard and compression magnification views of the left breast were performed and demonstrate stable 4 mm group of calcifications in the outer slightly upper left breast, far posterior depth. The abnormality has the appearance of a small calcified artery. IMPRESSION: Left breast probably benign calcifications. RECOMMENDATION: Recommend bilateral diagnostic mammogram in 6 months to restore patient's screening schedule. I have discussed the findings and recommendations with the patient. If applicable, a reminder letter will be sent to the patient regarding the next appointment. BI-RADS CATEGORY  2: Benign. Electronically Signed   By: Ted Mcalpine M.D.   On: 01/18/2023 14:09       Assessment & Plan:  Hypertension, essential Assessment & Plan: Continue lisinopril/hydrochlorothiazide.  Follow pressures.  Follow metabolic panel.    Hypercholesteremia Assessment & Plan: On simvastatin.  Follow lipid panel and liver function tests.    Osteoporosis without current pathological fracture, unspecified osteoporosis type Assessment & Plan: Documented history of osteoporosis.  Calcium, vitamin d and weight bearing exercise.  Will f/u regarding f/u bone density and further treatment. Review records.   Stage 3 chronic kidney disease, unspecified whether stage 3a or 3b CKD  (HCC) Assessment & Plan: Documented history of ckd.  Avoid antiinflammatory medication.  Continues on ace inhibitor.  Follow metabolic panel.    Hyperglycemia Assessment & Plan: Documented history of elevated blood glucose.  Low carb diet and exercise.  Follow met b and A1c.       Dale Marion, MD

## 2023-02-04 ENCOUNTER — Encounter: Payer: Self-pay | Admitting: Internal Medicine

## 2023-02-04 DIAGNOSIS — I10 Essential (primary) hypertension: Secondary | ICD-10-CM | POA: Insufficient documentation

## 2023-02-04 DIAGNOSIS — E7800 Pure hypercholesterolemia, unspecified: Secondary | ICD-10-CM | POA: Insufficient documentation

## 2023-02-04 DIAGNOSIS — N183 Chronic kidney disease, stage 3 unspecified: Secondary | ICD-10-CM | POA: Insufficient documentation

## 2023-02-04 DIAGNOSIS — M810 Age-related osteoporosis without current pathological fracture: Secondary | ICD-10-CM | POA: Insufficient documentation

## 2023-02-04 DIAGNOSIS — R739 Hyperglycemia, unspecified: Secondary | ICD-10-CM | POA: Insufficient documentation

## 2023-02-04 NOTE — Assessment & Plan Note (Signed)
On simvastatin.  Follow lipid panel and liver function tests.   

## 2023-02-04 NOTE — Assessment & Plan Note (Signed)
Documented history of ckd.  Avoid antiinflammatory medication.  Continues on ace inhibitor.  Follow metabolic panel.

## 2023-02-04 NOTE — Assessment & Plan Note (Signed)
Documented history of osteoporosis.  Calcium, vitamin d and weight bearing exercise.  Will f/u regarding f/u bone density and further treatment. Review records.

## 2023-02-04 NOTE — Assessment & Plan Note (Signed)
Documented history of elevated blood glucose.  Low carb diet and exercise.  Follow met b and A1c.

## 2023-02-04 NOTE — Assessment & Plan Note (Signed)
Continue lisinopril/hydrochlorothiazide.  Follow pressures.  Follow metabolic panel.

## 2023-02-08 ENCOUNTER — Other Ambulatory Visit: Payer: Self-pay | Admitting: Internal Medicine

## 2023-02-08 DIAGNOSIS — R921 Mammographic calcification found on diagnostic imaging of breast: Secondary | ICD-10-CM

## 2023-05-24 ENCOUNTER — Telehealth: Payer: Self-pay | Admitting: Internal Medicine

## 2023-05-24 DIAGNOSIS — R739 Hyperglycemia, unspecified: Secondary | ICD-10-CM

## 2023-05-24 DIAGNOSIS — E7800 Pure hypercholesterolemia, unspecified: Secondary | ICD-10-CM

## 2023-05-24 DIAGNOSIS — I10 Essential (primary) hypertension: Secondary | ICD-10-CM

## 2023-05-24 NOTE — Telephone Encounter (Signed)
Patient need lab orders.

## 2023-05-28 NOTE — Telephone Encounter (Signed)
 Fasting labs ordered

## 2023-05-28 NOTE — Addendum Note (Signed)
Addended by: Rita Ohara D on: 05/28/2023 09:37 AM   Modules accepted: Orders

## 2023-06-01 ENCOUNTER — Other Ambulatory Visit: Payer: PPO

## 2023-06-04 ENCOUNTER — Ambulatory Visit: Payer: PPO | Admitting: Internal Medicine

## 2023-06-08 ENCOUNTER — Other Ambulatory Visit (INDEPENDENT_AMBULATORY_CARE_PROVIDER_SITE_OTHER): Payer: PPO

## 2023-06-08 DIAGNOSIS — R739 Hyperglycemia, unspecified: Secondary | ICD-10-CM

## 2023-06-08 DIAGNOSIS — I10 Essential (primary) hypertension: Secondary | ICD-10-CM

## 2023-06-08 DIAGNOSIS — E7800 Pure hypercholesterolemia, unspecified: Secondary | ICD-10-CM | POA: Diagnosis not present

## 2023-06-08 LAB — HEPATIC FUNCTION PANEL
ALT: 15 U/L (ref 0–35)
AST: 32 U/L (ref 0–37)
Albumin: 4.3 g/dL (ref 3.5–5.2)
Alkaline Phosphatase: 53 U/L (ref 39–117)
Bilirubin, Direct: 0.2 mg/dL (ref 0.0–0.3)
Total Bilirubin: 0.7 mg/dL (ref 0.2–1.2)
Total Protein: 7 g/dL (ref 6.0–8.3)

## 2023-06-08 LAB — LIPID PANEL
Cholesterol: 131 mg/dL (ref 0–200)
HDL: 72.4 mg/dL (ref >39.00)
LDL Cholesterol: 46 mg/dL (ref 0–99)
NonHDL: 58.1
Total CHOL/HDL Ratio: 2
Triglycerides: 62 mg/dL (ref 0.0–149.0)
VLDL: 12.4 mg/dL (ref 0.0–40.0)

## 2023-06-08 LAB — BASIC METABOLIC PANEL
BUN: 20 mg/dL (ref 6–23)
CO2: 28 meq/L (ref 19–32)
Calcium: 10.3 mg/dL (ref 8.4–10.5)
Chloride: 97 meq/L (ref 96–112)
Creatinine, Ser: 1.23 mg/dL — ABNORMAL HIGH (ref 0.40–1.20)
GFR: 39.24 mL/min — ABNORMAL LOW (ref >60.00)
Glucose, Bld: 114 mg/dL — ABNORMAL HIGH (ref 70–99)
Potassium: 4 meq/L (ref 3.5–5.1)
Sodium: 135 meq/L (ref 135–145)

## 2023-06-08 LAB — HEMOGLOBIN A1C: Hgb A1c MFr Bld: 6.1 % (ref 4.6–6.5)

## 2023-07-12 ENCOUNTER — Ambulatory Visit
Admission: RE | Admit: 2023-07-12 | Discharge: 2023-07-12 | Disposition: A | Discharge status: Home / Self Care | Payer: PPO | Source: Ambulatory Visit | Attending: Internal Medicine | Admitting: Internal Medicine

## 2023-07-12 DIAGNOSIS — R921 Mammographic calcification found on diagnostic imaging of breast: Secondary | ICD-10-CM | POA: Diagnosis not present

## 2023-07-12 DIAGNOSIS — R92333 Mammographic heterogeneous density, bilateral breasts: Secondary | ICD-10-CM | POA: Diagnosis not present

## 2023-07-20 ENCOUNTER — Encounter: Payer: Self-pay | Admitting: Internal Medicine

## 2023-07-20 ENCOUNTER — Ambulatory Visit (INDEPENDENT_AMBULATORY_CARE_PROVIDER_SITE_OTHER): Payer: PPO | Admitting: Internal Medicine

## 2023-07-20 VITALS — BP 130/76 | HR 76 | Temp 98.0°F | Resp 16 | Ht 60.0 in | Wt 116.0 lb

## 2023-07-20 DIAGNOSIS — N183 Chronic kidney disease, stage 3 unspecified: Secondary | ICD-10-CM

## 2023-07-20 DIAGNOSIS — M810 Age-related osteoporosis without current pathological fracture: Secondary | ICD-10-CM | POA: Diagnosis not present

## 2023-07-20 DIAGNOSIS — I10 Essential (primary) hypertension: Secondary | ICD-10-CM

## 2023-07-20 DIAGNOSIS — F439 Reaction to severe stress, unspecified: Secondary | ICD-10-CM

## 2023-07-20 DIAGNOSIS — R739 Hyperglycemia, unspecified: Secondary | ICD-10-CM

## 2023-07-20 DIAGNOSIS — E7800 Pure hypercholesterolemia, unspecified: Secondary | ICD-10-CM

## 2023-07-20 NOTE — Patient Instructions (Signed)
Cock up wrist splints

## 2023-07-20 NOTE — Progress Notes (Signed)
 Subjective:    Patient ID: Michele Jordan, female    DOB: May 25, 1932, 89 y.o.   MRN: [id number]  Patient here for  Chief Complaint  Patient presents with   Medical Management of Chronic Issues    HPI Here to follow up regarding hypertension, CKD, hypothyroidism, diabetes and hypercholesterolemia. She reports she is doing better. Feels better. Sleeping better. Stress is better. Stays active. No chest pain or sob reported. Reports eating well. No abdominal pain or bowel change.    Past Medical History:  Diagnosis Date   Allergy    Arthritis    GERD (gastroesophageal reflux disease)    Hyperlipidemia    Hypertension    Personal history of colonic polyps    Thyroid disease    Past Surgical History:  Procedure Laterality Date   APPENDECTOMY  1950   BREAST EXCISIONAL BIOPSY Left 1980   neg   COLONOSCOPY  2011   Family History  Problem Relation Age of Onset   Early death Mother    Diabetes Mother    Cancer Mother    Heart disease Father    Early death Father    Heart attack Father    Diabetes Sister    Early death Brother    Hyperlipidemia Daughter    Depression Daughter    Asthma Daughter    Mental illness Daughter    Breast cancer Neg Hx    Social History   Socioeconomic History   Marital status: Married    Spouse name: Not on file   Number of children: Not on file   Years of education: Not on file   Highest education level: Not on file  Occupational History   Not on file  Tobacco Use   Smoking status: Never   Smokeless tobacco: Never  Substance and Sexual Activity   Alcohol use: No   Drug use: No   Sexual activity: Not on file  Other Topics Concern   Not on file  Social History Narrative   Not on file   Social Drivers of Health   Financial Resource Strain: Low Risk  (10/19/2022)   Received from [facility]   Overall Financial Resource Strain (CARDIA)    Difficulty of Paying Living Expenses: Not hard at all  Food Insecurity: No  Food Insecurity (10/19/2022)   Received from [facility]   Hunger Vital Sign    Worried About Running Out of Food in the Last Year: Never true    Ran Out of Food in the Last Year: Never true  Transportation Needs: No Transportation Needs (10/19/2022)   Received from [facility] - Transportation    In the past 12 months, has lack of transportation kept you from medical appointments or from getting medications?: No    Lack of Transportation (Non-Medical): No  Physical Activity: Not on file  Stress: Not on file  Social Connections: Not on file     Review of Systems  Constitutional:  Negative for appetite change and unexpected weight change.  HENT:  Negative for congestion and sinus pressure.   Respiratory:  Negative for cough, chest tightness and shortness of breath.   Cardiovascular:  Negative for chest pain, palpitations and leg swelling.  Gastrointestinal:  Negative for abdominal pain, diarrhea, nausea and vomiting.  Genitourinary:  Negative for difficulty urinating and dysuria.  Musculoskeletal:  Negative for joint swelling and myalgias.  Skin:  Negative for color change and rash.  Neurological:  Negative  for dizziness and headaches.  Psychiatric/Behavioral:  Negative for agitation and dysphoric mood.        Objective:     BP 130/76   Pulse 76   Temp 98 F (36.7 C)   Resp 16   Ht 5' (1.524 m)   Wt 116 lb (52.6 kg)   SpO2 98%   BMI 22.65 kg/m  Wt Readings from Last 3 Encounters:  07/20/23 116 lb (52.6 kg)  01/31/23 114 lb 12.8 oz (52.1 kg)  01/24/18 136 lb 4.8 oz (61.8 kg)    Physical Exam Vitals reviewed.  Constitutional:      General: She is not in acute distress.    Appearance: Normal appearance.  HENT:     Head: Normocephalic and atraumatic.     Right Ear: External ear normal.     Left Ear: External ear normal.     Mouth/Throat:     Pharynx: No oropharyngeal exudate or posterior oropharyngeal erythema.  Eyes:      General: No scleral icterus.       Right eye: No discharge.        Left eye: No discharge.     Conjunctiva/sclera: Conjunctivae normal.  Neck:     Thyroid: No thyromegaly.  Cardiovascular:     Rate and Rhythm: Normal rate and regular rhythm.  Pulmonary:     Effort: No respiratory distress.     Breath sounds: Normal breath sounds. No wheezing.  Abdominal:     General: Bowel sounds are normal.     Palpations: Abdomen is soft.     Tenderness: There is no abdominal tenderness.  Musculoskeletal:        General: No swelling or tenderness.     Cervical back: Neck supple. No tenderness.  Lymphadenopathy:     Cervical: No cervical adenopathy.  Skin:    Findings: No erythema or rash.  Neurological:     Mental Status: She is alert.  Psychiatric:        Mood and Affect: Mood normal.        Behavior: Behavior normal.         Outpatient Encounter Medications as of 07/20/2023  Medication Sig   ASPIRIN 81 PO Take 1 tablet by mouth at bedtime.   famotidine (PEPCID) 20 MG tablet Take 20 mg by mouth daily.   levothyroxine (SYNTHROID, LEVOTHROID) 25 MCG tablet TAKE ONE TABLET BY MOUTH ONCE DAILY. TAKE ON AN EMPTY STOMACH WITH A GLASS OF WATER AT LEAST 30-60 MINUTES BEFORE BREAKFAST   lisinopril-hydrochlorothiazide (PRINZIDE,ZESTORETIC) 20-12.5 MG per tablet Take 1 tablet by mouth daily.   simvastatin (ZOCOR) 40 MG tablet Take 40 mg by mouth daily.   No facility-administered encounter medications on file as of 07/20/2023.     Lab Results  Component Value Date   GLUCOSE 114 (H) 06/08/2023   CHOL 131 06/08/2023   TRIG 62.0 06/08/2023   HDL 72.40 06/08/2023   LDLCALC 46 06/08/2023   ALT 15 06/08/2023   AST 32 06/08/2023   NA 135 06/08/2023   K 4.0 06/08/2023   CL 97 06/08/2023   CREATININE 1.23 (H) 06/08/2023   BUN 20 06/08/2023   CO2 28 06/08/2023   HGBA1C 6.1 06/08/2023    MM 3D DIAGNOSTIC MAMMOGRAM BILATERAL BREAST Result Date: 07/12/2023 CLINICAL DATA:  Follow-up of LEFT  breast calcifications EXAM: DIGITAL DIAGNOSTIC BILATERAL MAMMOGRAM WITH TOMOSYNTHESIS AND CAD TECHNIQUE: Bilateral digital diagnostic mammography and breast tomosynthesis was performed. The images were evaluated with computer-aided detection. COMPARISON:  Previous exam(s).  ACR Breast Density Category c: The breasts are heterogeneously dense, which may obscure small masses. FINDINGS: Spot magnification views of the LEFT breast demonstrate 2 groups of benign vascular calcifications in the outer breast at middle and posterior depth. No suspicious mass, distortion, or microcalcifications are identified to suggest presence of malignancy bilaterally. IMPRESSION: No mammographic evidence of malignancy bilaterally. RECOMMENDATION: Screening mammogram in one year.(Code:SM-B-01Y) I have discussed the findings and recommendations with the patient. If applicable, a reminder letter will be sent to the patient regarding the next appointment. BI-RADS CATEGORY  2: Benign. Electronically Signed   By: Clancy Crimes M.D.   On: 07/12/2023 10:51       Assessment & Plan:  Stage 3 chronic kidney disease, unspecified whether stage 3a or 3b CKD (HCC) Assessment & Plan: GFR last check 39. Continue ace inhibitor. Avoid antiinflammatory medication. Follow metabolic panel.    Hypercholesteremia Assessment & Plan: Continues on simvastatin. Follow lipid panel and liver function tests.   Orders: -     Lipid panel; Future -     Hepatic function panel; Future  Hypertension, essential Assessment & Plan: Continue lisinopril/hydrochlorothiazide.  Follow pressures.  Follow metabolic panel. No changes in medication today.   Orders: -     Basic metabolic panel with GFR; Future  Hyperglycemia Assessment & Plan: Documented history of elevated blood sugar. Follow met b and A1c.   Lab Results  Component Value Date   HGBA1C 6.1 06/08/2023     Orders: -     Hemoglobin A1c; Future  Osteoporosis without current  pathological fracture, unspecified osteoporosis type Assessment & Plan: Documented history of osteoporosis.  Calcium, vitamin d and weight bearing exercise.  Needs f/u bone density.    Stress Assessment & Plan: Doing better. Sleeping better. Feeing better. Follow.       Dellar Fenton, MD

## 2023-07-22 ENCOUNTER — Encounter: Payer: Self-pay | Admitting: Internal Medicine

## 2023-07-22 DIAGNOSIS — F439 Reaction to severe stress, unspecified: Secondary | ICD-10-CM | POA: Insufficient documentation

## 2023-07-22 NOTE — Assessment & Plan Note (Signed)
 Continue lisinopril/hydrochlorothiazide.  Follow pressures.  Follow metabolic panel. No changes in medication today.

## 2023-07-22 NOTE — Assessment & Plan Note (Signed)
 Doing better. Sleeping better. Feeing better. Follow.

## 2023-07-22 NOTE — Assessment & Plan Note (Signed)
 Documented history of osteoporosis.  Calcium, vitamin d and weight bearing exercise.  Needs f/u bone density.

## 2023-07-22 NOTE — Assessment & Plan Note (Signed)
 Documented history of elevated blood sugar. Follow met b and A1c.   Lab Results  Component Value Date   HGBA1C 6.1 06/08/2023

## 2023-07-22 NOTE — Assessment & Plan Note (Signed)
 GFR last check 39. Continue ace inhibitor. Avoid antiinflammatory medication. Follow metabolic panel.

## 2023-07-22 NOTE — Assessment & Plan Note (Signed)
 Continues on simvastatin. Follow lipid panel and liver function tests.

## 2023-08-13 DIAGNOSIS — H25813 Combined forms of age-related cataract, bilateral: Secondary | ICD-10-CM | POA: Diagnosis not present

## 2023-09-07 ENCOUNTER — Telehealth: Payer: Self-pay | Admitting: Internal Medicine

## 2023-09-07 NOTE — Telephone Encounter (Signed)
 Dr Geralyn Knee will not be in the office the day of your scheduled visit 10/25/23 . Please give the office a call at [phone] and we will get you rescheduled.  Thank you  [id number] please reschedule this pt when they call back -[facility]

## 2023-10-05 ENCOUNTER — Other Ambulatory Visit: Payer: Self-pay

## 2023-10-05 ENCOUNTER — Ambulatory Visit (INDEPENDENT_AMBULATORY_CARE_PROVIDER_SITE_OTHER): Admitting: Internal Medicine

## 2023-10-05 ENCOUNTER — Ambulatory Visit: Payer: Self-pay | Admitting: Internal Medicine

## 2023-10-05 ENCOUNTER — Encounter: Payer: Self-pay | Admitting: Internal Medicine

## 2023-10-05 VITALS — BP 130/72 | HR 82 | Temp 98.0°F | Resp 16 | Ht 60.0 in | Wt 115.0 lb

## 2023-10-05 DIAGNOSIS — E7800 Pure hypercholesterolemia, unspecified: Secondary | ICD-10-CM | POA: Diagnosis not present

## 2023-10-05 DIAGNOSIS — F439 Reaction to severe stress, unspecified: Secondary | ICD-10-CM

## 2023-10-05 DIAGNOSIS — H6120 Impacted cerumen, unspecified ear: Secondary | ICD-10-CM | POA: Insufficient documentation

## 2023-10-05 DIAGNOSIS — R5383 Other fatigue: Secondary | ICD-10-CM | POA: Diagnosis not present

## 2023-10-05 DIAGNOSIS — I10 Essential (primary) hypertension: Secondary | ICD-10-CM | POA: Diagnosis not present

## 2023-10-05 DIAGNOSIS — R739 Hyperglycemia, unspecified: Secondary | ICD-10-CM | POA: Diagnosis not present

## 2023-10-05 DIAGNOSIS — N183 Chronic kidney disease, stage 3 unspecified: Secondary | ICD-10-CM | POA: Diagnosis not present

## 2023-10-05 DIAGNOSIS — E871 Hypo-osmolality and hyponatremia: Secondary | ICD-10-CM

## 2023-10-05 DIAGNOSIS — H6121 Impacted cerumen, right ear: Secondary | ICD-10-CM

## 2023-10-05 LAB — CBC WITH DIFFERENTIAL/PLATELET
Basophils Absolute: 0 10*3/uL (ref 0.0–0.1)
Basophils Relative: 0.5 % (ref 0.0–3.0)
Eosinophils Absolute: 0.1 10*3/uL (ref 0.0–0.7)
Eosinophils Relative: 1 % (ref 0.0–5.0)
HCT: 35.8 % — ABNORMAL LOW (ref 36.0–46.0)
Hemoglobin: 12.1 g/dL (ref 12.0–15.0)
Lymphocytes Relative: 26 % (ref 12.0–46.0)
Lymphs Abs: 1.3 10*3/uL (ref 0.7–4.0)
MCHC: 33.9 g/dL (ref 30.0–36.0)
MCV: 86.5 fl (ref 78.0–100.0)
Monocytes Absolute: 0.4 10*3/uL (ref 0.1–1.0)
Monocytes Relative: 7.5 % (ref 3.0–12.0)
Neutro Abs: 3.3 10*3/uL (ref 1.4–7.7)
Neutrophils Relative %: 65 % (ref 43.0–77.0)
Platelets: 163 10*3/uL (ref 150.0–400.0)
RBC: 4.14 Mil/uL (ref 3.87–5.11)
RDW: 14.6 % (ref 11.5–15.5)
WBC: 5 10*3/uL (ref 4.0–10.5)

## 2023-10-05 LAB — HEMOGLOBIN A1C: Hgb A1c MFr Bld: 6 % (ref 4.6–6.5)

## 2023-10-05 LAB — LIPID PANEL
Cholesterol: 134 mg/dL (ref 0–200)
HDL: 74.6 mg/dL (ref >39.00)
LDL Cholesterol: 50 mg/dL (ref 0–99)
NonHDL: 59.17
Total CHOL/HDL Ratio: 2
Triglycerides: 47 mg/dL (ref 0.0–149.0)
VLDL: 9.4 mg/dL (ref 0.0–40.0)

## 2023-10-05 LAB — BASIC METABOLIC PANEL WITH GFR
BUN: 16 mg/dL (ref 6–23)
CO2: 29 meq/L (ref 19–32)
Calcium: 10 mg/dL (ref 8.4–10.5)
Chloride: 95 meq/L — ABNORMAL LOW (ref 96–112)
Creatinine, Ser: 1.09 mg/dL (ref 0.40–1.20)
GFR: 45.26 mL/min — ABNORMAL LOW (ref >60.00)
Glucose, Bld: 111 mg/dL — ABNORMAL HIGH (ref 70–99)
Potassium: 3.8 meq/L (ref 3.5–5.1)
Sodium: 131 meq/L — ABNORMAL LOW (ref 135–145)

## 2023-10-05 LAB — HEPATIC FUNCTION PANEL
ALT: 15 U/L (ref 0–35)
AST: 31 U/L (ref 0–37)
Albumin: 4.3 g/dL (ref 3.5–5.2)
Alkaline Phosphatase: 54 U/L (ref 39–117)
Bilirubin, Direct: 0.1 mg/dL (ref 0.0–0.3)
Total Bilirubin: 0.6 mg/dL (ref 0.2–1.2)
Total Protein: 7 g/dL (ref 6.0–8.3)

## 2023-10-05 LAB — TSH: TSH: 1.14 u[IU]/mL (ref 0.35–5.50)

## 2023-10-05 NOTE — Assessment & Plan Note (Signed)
 Continue lisinopril/hydrochlorothiazide.  Follow pressures.  Check metabolic panel today. No changes in medication.

## 2023-10-05 NOTE — Assessment & Plan Note (Signed)
 Previous fatigue. Is some better now. Check tsh and cbc with metabolic panel. No acute symptoms.

## 2023-10-05 NOTE — Assessment & Plan Note (Signed)
 Documented history of elevated blood sugar. Follow met b and A1c.  Check fasting labs today.  Lab Results  Component Value Date   HGBA1C 6.1 06/08/2023

## 2023-10-05 NOTE — Assessment & Plan Note (Signed)
 Discussed. Overall appears to be handling things relatively well.  Follow.

## 2023-10-05 NOTE — Progress Notes (Unsigned)
 Subjective:    Patient ID: Michele Jordan, female    DOB: 01-Nov-1932, 89 y.o.   MRN: [id number]  Patient here for  Chief Complaint  Patient presents with   Medical Management of Chronic Issues    HPI Here for a scheduled follow up - follow up regarding hypertension, CKD, hypothyroidism, diabetes and hypercholesterolemia. She reports she is doing relatively well. Stays active. Feels overworked at times. No chest pain or sob reported. No cough or congestion. No abdominal pain or bowel change reported. Some previous fatigue.    Past Medical History:  Diagnosis Date   Allergy    Arthritis    GERD (gastroesophageal reflux disease)    Hyperlipidemia    Hypertension    Personal history of colonic polyps    Thyroid  disease    Past Surgical History:  Procedure Laterality Date   APPENDECTOMY  1950   BREAST EXCISIONAL BIOPSY Left 1980   neg   COLONOSCOPY  2011   Family History  Problem Relation Age of Onset   Early death Mother    Diabetes Mother    Cancer Mother    Heart disease Father    Early death Father    Heart attack Father    Diabetes Sister    Early death Brother    Hyperlipidemia Daughter    Depression Daughter    Asthma Daughter    Mental illness Daughter    Breast cancer Neg Hx    Social History   Socioeconomic History   Marital status: Married    Spouse name: Not on file   Number of children: Not on file   Years of education: Not on file   Highest education level: Not on file  Occupational History   Not on file  Tobacco Use   Smoking status: Never   Smokeless tobacco: Never  Substance and Sexual Activity   Alcohol use: No   Drug use: No   Sexual activity: Not on file  Other Topics Concern   Not on file  Social History Narrative   Not on file   Social Drivers of Health   Financial Resource Strain: Low Risk  (10/19/2022)   Received from [facility]   Overall Financial Resource Strain (CARDIA)    Difficulty of Paying Living  Expenses: Not hard at all  Food Insecurity: No Food Insecurity (10/19/2022)   Received from [facility]   Hunger Vital Sign    Within the past 12 months, you worried that your food would run out before you got the money to buy more.: Never true    Within the past 12 months, the food you bought just didn't last and you didn't have money to get more.: Never true  Transportation Needs: No Transportation Needs (10/19/2022)   Received from [facility] - Transportation    In the past 12 months, has lack of transportation kept you from medical appointments or from getting medications?: No    Lack of Transportation (Non-Medical): No  Physical Activity: Not on file  Stress: Not on file  Social Connections: Not on file     Review of Systems  Constitutional:  Positive for fatigue. Negative for appetite change and unexpected weight change.  HENT:  Negative for congestion and sinus pressure.   Respiratory:  Negative for cough, chest tightness and shortness of breath.   Cardiovascular:  Negative for chest pain, palpitations and leg swelling.  Gastrointestinal:  Negative for abdominal pain, diarrhea,  nausea and vomiting.  Genitourinary:  Negative for difficulty urinating and dysuria.  Musculoskeletal:  Negative for joint swelling and myalgias.  Skin:  Negative for color change and rash.  Neurological:  Negative for dizziness and headaches.  Psychiatric/Behavioral:  Negative for agitation and dysphoric mood.        Objective:     BP 130/72   Pulse 82   Temp 98 F (36.7 C)   Resp 16   Ht 5' (1.524 m)   Wt 115 lb (52.2 kg)   SpO2 98%   BMI 22.46 kg/m  Wt Readings from Last 3 Encounters:  10/05/23 115 lb (52.2 kg)  07/20/23 116 lb (52.6 kg)  01/31/23 114 lb 12.8 oz (52.1 kg)    Physical Exam Vitals reviewed.  Constitutional:      General: She is not in acute distress.    Appearance: Normal appearance.  HENT:     Head: Normocephalic and  atraumatic.     Right Ear: External ear normal. There is impacted cerumen.     Left Ear: External ear normal.     Ears:     Comments: Cerumen present left ear. No impaction.     Mouth/Throat:     Pharynx: No oropharyngeal exudate or posterior oropharyngeal erythema.   Eyes:     General: No scleral icterus.       Right eye: No discharge.        Left eye: No discharge.     Conjunctiva/sclera: Conjunctivae normal.   Neck:     Thyroid : No thyromegaly.   Cardiovascular:     Rate and Rhythm: Normal rate and regular rhythm.  Pulmonary:     Effort: No respiratory distress.     Breath sounds: Normal breath sounds. No wheezing.  Abdominal:     General: Bowel sounds are normal.     Palpations: Abdomen is soft.     Tenderness: There is no abdominal tenderness.   Musculoskeletal:        General: No swelling or tenderness.     Cervical back: Neck supple. No tenderness.  Lymphadenopathy:     Cervical: No cervical adenopathy.   Skin:    Findings: No erythema or rash.   Neurological:     Mental Status: She is alert.   Psychiatric:        Mood and Affect: Mood normal.        Behavior: Behavior normal.     {Perform Simple Foot Exam  Perform Detailed exam:1} {Insert foot Exam (Optional):30965}   Outpatient Encounter Medications as of 10/05/2023  Medication Sig   ASPIRIN 81 PO Take 1 tablet by mouth at bedtime.   famotidine (PEPCID) 20 MG tablet Take 20 mg by mouth daily.   levothyroxine (SYNTHROID, LEVOTHROID) 25 MCG tablet TAKE ONE TABLET BY MOUTH ONCE DAILY. TAKE ON AN EMPTY STOMACH WITH A GLASS OF WATER AT LEAST 30-60 MINUTES BEFORE BREAKFAST   lisinopril-hydrochlorothiazide (PRINZIDE,ZESTORETIC) 20-12.5 MG per tablet Take 1 tablet by mouth daily.   simvastatin (ZOCOR) 40 MG tablet Take 40 mg by mouth daily.   No facility-administered encounter medications on file as of 10/05/2023.     Lab Results  Component Value Date   GLUCOSE 114 (H) 06/08/2023   CHOL 131 06/08/2023    TRIG 62.0 06/08/2023   HDL 72.40 06/08/2023   LDLCALC 46 06/08/2023   ALT 15 06/08/2023   AST 32 06/08/2023   NA 135 06/08/2023   K 4.0 06/08/2023   CL 97 06/08/2023  CREATININE 1.23 (H) 06/08/2023   BUN 20 06/08/2023   CO2 28 06/08/2023   HGBA1C 6.1 06/08/2023    MM 3D DIAGNOSTIC MAMMOGRAM BILATERAL BREAST Result Date: 07/12/2023 CLINICAL DATA:  Follow-up of LEFT breast calcifications EXAM: DIGITAL DIAGNOSTIC BILATERAL MAMMOGRAM WITH TOMOSYNTHESIS AND CAD TECHNIQUE: Bilateral digital diagnostic mammography and breast tomosynthesis was performed. The images were evaluated with computer-aided detection. COMPARISON:  Previous exam(s). ACR Breast Density Category c: The breasts are heterogeneously dense, which may obscure small masses. FINDINGS: Spot magnification views of the LEFT breast demonstrate 2 groups of benign vascular calcifications in the outer breast at middle and posterior depth. No suspicious mass, distortion, or microcalcifications are identified to suggest presence of malignancy bilaterally. IMPRESSION: No mammographic evidence of malignancy bilaterally. RECOMMENDATION: Screening mammogram in one year.(Code:SM-B-01Y) I have discussed the findings and recommendations with the patient. If applicable, a reminder letter will be sent to the patient regarding the next appointment. BI-RADS CATEGORY  2: Benign. Electronically Signed   By: Corean Salter M.D.   On: 07/12/2023 10:51       Assessment & Plan:  Hypertension, essential Assessment & Plan: Continue lisinopril/hydrochlorothiazide.  Follow pressures.  Check metabolic panel today. No changes in medication.   Orders: -     Basic metabolic panel with GFR  Hyperglycemia Assessment & Plan: Documented history of elevated blood sugar. Follow met b and A1c.  Check fasting labs today.  Lab Results  Component Value Date   HGBA1C 6.1 06/08/2023     Orders: -     Hemoglobin A1c  Hypercholesteremia Assessment &  Plan: Continues on simvastatin. Follow lipid panel and liver function tests. Check lipid panel today.   Orders: -     CBC with Differential/Platelet -     TSH -     Hepatic function panel -     Lipid panel  Stage 3 chronic kidney disease, unspecified whether stage 3a or 3b CKD (HCC) Assessment & Plan: Continue ace inhibitor. Avoid antiinflammatory medication. Discussed staying hydrated. Follow metabolic panel.    Stress Assessment & Plan: Discussed. Overall appears to be handling things relatively well. Follow.    Other fatigue Assessment & Plan: Previous fatigue. Is some better now. Check tsh and cbc with metabolic panel. No acute symptoms.    Impacted cerumen of right ear Assessment & Plan: Ear irrigation today. Tolerated irrigation. Exam after irrigation - clear - TM clear - cerumen removed.       Allena Hamilton, MD

## 2023-10-05 NOTE — Assessment & Plan Note (Addendum)
 Ear irrigation today. Tolerated irrigation. Exam after irrigation - clear - TM clear - cerumen removed.

## 2023-10-05 NOTE — Progress Notes (Unsigned)
 Ear Cerumen Removal  Date/Time: 10/05/2023 4:10 PM  Performed by: Learta Sueanne BIRCH, LPN Authorized by: Glendia Shad, MD   Anesthesia: Local Anesthetic: none Location details: right ear Patient tolerance: patient tolerated the procedure well with no immediate complications Procedure type: irrigation  Sedation: Patient sedated: no

## 2023-10-05 NOTE — Assessment & Plan Note (Signed)
 Continues on simvastatin. Follow lipid panel and liver function tests. Check lipid panel today.

## 2023-10-05 NOTE — Assessment & Plan Note (Signed)
 Continue ace inhibitor. Avoid antiinflammatory medication. Discussed staying hydrated. Follow metabolic panel.

## 2023-10-07 ENCOUNTER — Encounter: Payer: Self-pay | Admitting: Internal Medicine

## 2023-10-10 ENCOUNTER — Other Ambulatory Visit

## 2023-10-10 DIAGNOSIS — E871 Hypo-osmolality and hyponatremia: Secondary | ICD-10-CM

## 2023-10-10 DIAGNOSIS — I10 Essential (primary) hypertension: Secondary | ICD-10-CM

## 2023-10-10 LAB — BASIC METABOLIC PANEL WITH GFR
BUN: 15 mg/dL (ref 6–23)
CO2: 28 meq/L (ref 19–32)
Calcium: 10.1 mg/dL (ref 8.4–10.5)
Chloride: 97 meq/L (ref 96–112)
Creatinine, Ser: 1.12 mg/dL (ref 0.40–1.20)
GFR: 43.81 mL/min — ABNORMAL LOW
Glucose, Bld: 104 mg/dL — ABNORMAL HIGH (ref 70–99)
Potassium: 3.9 meq/L (ref 3.5–5.1)
Sodium: 134 meq/L — ABNORMAL LOW (ref 135–145)

## 2023-10-10 LAB — SODIUM: Sodium: 134 meq/L — ABNORMAL LOW (ref 135–145)

## 2023-10-11 ENCOUNTER — Telehealth: Payer: Self-pay

## 2023-10-11 ENCOUNTER — Ambulatory Visit: Payer: Self-pay | Admitting: Internal Medicine

## 2023-10-11 NOTE — Telephone Encounter (Signed)
 Per Dr: Glendia:  Please notify lab to credit sodium lab. (Met b includes sodium and she does not need to be charged for sodium check just met b)

## 2023-10-16 ENCOUNTER — Ambulatory Visit (INDEPENDENT_AMBULATORY_CARE_PROVIDER_SITE_OTHER): Admitting: *Deleted

## 2023-10-16 VITALS — Ht 60.0 in | Wt 115.0 lb

## 2023-10-16 DIAGNOSIS — Z0000 Encounter for general adult medical examination without abnormal findings: Secondary | ICD-10-CM

## 2023-10-16 DIAGNOSIS — Z780 Asymptomatic menopausal state: Secondary | ICD-10-CM

## 2023-10-16 NOTE — Progress Notes (Signed)
 Subjective:   Michele [place] is a 89 y.o. who presents for a Medicare Wellness preventive visit.  As a reminder, Annual Wellness Visits don't include a physical exam, and some assessments may be limited, especially if this visit is performed virtually. We may recommend an in-person follow-up visit with your provider if needed.  Visit Complete: Virtual I connected with  Michele [place] on 10/16/23 by a audio enabled telemedicine application and verified that I am speaking with the correct person using two identifiers.  Patient Location: Home  Provider Location: Home Office  I discussed the limitations of evaluation and management by telemedicine. The patient expressed understanding and agreed to proceed.  Vital Signs: Because this visit was a virtual/telehealth visit, some criteria may be missing or patient reported. Any vitals not documented were not able to be obtained and vitals that have been documented are patient reported.  VideoDeclined- This patient declined Librarian, academic. Therefore the visit was completed with audio only.  Persons Participating in Visit: Patient.  AWV Questionnaire: No: Patient Medicare AWV questionnaire was not completed prior to this visit.  Cardiac Risk Factors include: advanced age (>72men, >4 women);dyslipidemia;hypertension     Objective:    Today's Vitals   10/16/23 1417  Weight: 115 lb (52.2 kg)  Height: 5' (1.524 m)   Body mass index is 22.46 kg/m.     10/16/2023    2:32 PM 12/16/2021    6:40 PM 01/24/2018    1:33 PM  Advanced Directives  Does Patient Have a Medical Advance Directive? Yes No Yes   Type of Estate agent of [place];Living will  Healthcare Power of [place];Living will  Copy of Healthcare Power of Attorney in Chart? No - copy requested  Yes      Data saved with a previous flowsheet row definition    Current Medications (verified) Outpatient Encounter Medications  as of 10/16/2023  Medication Sig   ASPIRIN 81 PO Take 1 tablet by mouth at bedtime.   famotidine (PEPCID) 20 MG tablet Take 20 mg by mouth daily. (Patient taking differently: Take 20 mg by mouth daily as needed.)   levothyroxine (SYNTHROID, LEVOTHROID) 25 MCG tablet TAKE ONE TABLET BY MOUTH ONCE DAILY. TAKE ON AN EMPTY STOMACH WITH A GLASS OF WATER AT LEAST 30-60 MINUTES BEFORE BREAKFAST   lisinopril-hydrochlorothiazide (PRINZIDE,ZESTORETIC) 20-12.5 MG per tablet Take 1 tablet by mouth daily.   simvastatin (ZOCOR) 40 MG tablet Take 40 mg by mouth daily.   No facility-administered encounter medications on file as of 10/16/2023.    Allergies (verified) Alendronate   History: Past Medical History:  Diagnosis Date   Allergy    Arthritis    GERD (gastroesophageal reflux disease)    Hyperlipidemia    Hypertension    Personal history of colonic polyps    Thyroid  disease    Past Surgical History:  Procedure Laterality Date   APPENDECTOMY  1950   BREAST EXCISIONAL BIOPSY Left 1980   neg   COLONOSCOPY  2011   Family History  Problem Relation Age of Onset   Early death Mother    Diabetes Mother    Cancer Mother    Heart disease Father    Early death Father    Heart attack Father    Diabetes Sister    Early death Brother    Hyperlipidemia Daughter    Depression Daughter    Asthma Daughter    Mental illness Daughter    Breast cancer Neg Hx  Social History   Socioeconomic History   Marital status: Married    Spouse name: Not on file   Number of children: Not on file   Years of education: Not on file   Highest education level: Not on file  Occupational History   Not on file  Tobacco Use   Smoking status: Never   Smokeless tobacco: Never  Substance and Sexual Activity   Alcohol use: No   Drug use: No   Sexual activity: Not on file  Other Topics Concern   Not on file  Social History Narrative   married   Social Drivers of Health   Financial Resource Strain: Low  Risk  (10/16/2023)   Overall Financial Resource Strain (CARDIA)    Difficulty of Paying Living Expenses: Not hard at all  Food Insecurity: No Food Insecurity (10/16/2023)   Hunger Vital Sign    Worried About Running Out of Food in the Last Year: Never true    Ran Out of Food in the Last Year: Never true  Transportation Needs: No Transportation Needs (10/16/2023)   PRAPARE - Administrator, Civil Service (Medical): No    Lack of Transportation (Non-Medical): No  Physical Activity: Inactive (10/16/2023)   Exercise Vital Sign    Days of Exercise per Week: 0 days    Minutes of Exercise per Session: 0 min  Stress: No Stress Concern Present (10/16/2023)   [employer] of Occupational Health - Occupational Stress Questionnaire    Feeling of Stress: Only a little  Social Connections: Moderately Isolated (10/16/2023)   Social Connection and Isolation Panel    Frequency of Communication with Friends and Family: Twice a week    Frequency of Social Gatherings with Friends and Family: Twice a week    Attends Religious Services: Never    Database administrator or Organizations: No    Attends Engineer, structural: Never    Marital Status: Married    Tobacco Counseling Counseling given: Not Answered    Clinical Intake:  Pre-visit preparation completed: Yes  Pain : No/denies pain     BMI - recorded: 22.46 Nutritional Status: BMI of 19-24  Normal Nutritional Risks: None Diabetes: No  Lab Results  Component Value Date   HGBA1C 6.0 10/05/2023   HGBA1C 6.1 06/08/2023     How often do you need to have someone help you when you read instructions, pamphlets, or other written materials from your doctor or pharmacy?: 1 - Never  Interpreter Needed?: No  Information entered by :: R. Sabel Hornbeck LPN   Activities of Daily Living     10/16/2023    2:19 PM  In your present state of health, do you have any difficulty performing the following activities:  Hearing? 0  Vision? 0   Comment readers  Difficulty concentrating or making decisions? 0  Walking or climbing stairs? 0  Dressing or bathing? 0  Doing errands, shopping? 0  Preparing Food and eating ? N  Using the Toilet? N  In the past six months, have you accidently leaked urine? N  Do you have problems with loss of bowel control? N  Managing your Medications? N  Managing your Finances? N  Housekeeping or managing your Housekeeping? N    Patient Care Team: Glendia Shad, MD as PCP - General (Internal Medicine) Dellie Louanne MATSU, MD (General Surgery) Lenon Layman ORN, MD (Internal Medicine)  I have updated your Care Teams any recent Medical Services you may have received from other providers  in the past year.     Assessment:   This is a routine wellness examination for [place].  Hearing/Vision screen Hearing Screening - Comments:: No issues Vision Screening - Comments:: readers   Goals Addressed             This Visit's Progress    Patient Stated       Wants to eat healthy        Depression Screen     10/16/2023    2:24 PM 01/31/2023    1:40 PM 01/24/2018    1:36 PM  PHQ 2/9 Scores  PHQ - 2 Score 3 0 0  PHQ- 9 Score 3      Fall Risk     10/16/2023    2:21 PM 01/31/2023    1:40 PM 01/24/2018    1:36 PM  Fall Risk   Falls in the past year? 0 0 No   Number falls in past yr: 0 0   Injury with Fall? 0 0   Risk for fall due to : No Fall Risks No Fall Risks   Follow up Falls evaluation completed;Falls prevention discussed Falls evaluation completed      Data saved with a previous flowsheet row definition    MEDICARE RISK AT HOME:  Medicare Risk at Home Any stairs in or around the home?: Yes If so, are there any without handrails?: No Home free of loose throw rugs in walkways, pet beds, electrical cords, etc?: Yes Adequate lighting in your home to reduce risk of falls?: Yes Life alert?: Yes Use of a cane, walker or w/c?: No Grab bars in the bathroom?: Yes Shower  chair or bench in shower?: Yes Elevated toilet seat or a handicapped toilet?: Yes  TIMED UP AND GO:  Was the test performed?  No  Cognitive Function: 6CIT completed        10/16/2023    2:32 PM  6CIT Screen  What Year? 0 points  What month? 0 points  What time? 0 points  Count back from 20 0 points  Months in reverse 0 points  Repeat phrase 4 points  Total Score 4 points    Immunizations Immunization History  Administered Date(s) Administered   Fluad Quad(high Dose 65+) 01/02/2019   Fluzone Influenza virus vaccine,trivalent (IIV3), split virus 01/19/2014, 01/15/2015   Influenza Inj Mdck Quad Pf 01/18/2016, 01/18/2018   Influenza, High Dose Seasonal PF 01/23/2023   Influenza-Unspecified 12/29/2016, 01/26/2021   Moderna Covid-19 Fall Seasonal Vaccine 57yrs & older 02/28/2022, 01/02/2023   Moderna Covid-19 Vaccine Bivalent Booster 32yrs & up 01/07/2021, 08/05/2021   Moderna Sars-Covid-2 Vaccination 04/22/2019, 05/20/2019, 02/17/2020, 08/27/2020   Pneumococcal Conjugate-13 03/20/2016   Pneumococcal Polysaccharide-23 07/27/2014, 02/28/2018   Respiratory Syncytial Virus Vaccine,Recomb Aduvanted(Arexvy) 05/18/2023   Zoster Recombinant(Shingrix) 11/11/2017, 03/04/2018    Screening Tests Health Maintenance  Topic Date Due   DTaP/Tdap/Td (1 - Tdap) Never done   DEXA SCAN  Never done   Medicare Annual Wellness (AWV)  10/19/2023   COVID-19 Vaccine (9 - Moderna risk 2024-25 season) 10/21/2023 (Originally 07/02/2023)   INFLUENZA VACCINE  11/09/2023   Pneumococcal Vaccine: 50+ Years  Completed   Zoster Vaccines- Shingrix  Completed   Hepatitis B Vaccines  Aged Out   HPV VACCINES  Aged Out   Meningococcal B Vaccine  Aged Out    Health Maintenance  Health Maintenance Due  Topic Date Due   DTaP/Tdap/Td (1 - Tdap) Never done   DEXA SCAN  Never done   Medicare Annual  Wellness (AWV)  10/19/2023   Health Maintenance Items Addressed: DEXA ordered Discussed the need to update  Tetanus (Tdap) vaccine  Additional Screening:  Vision Screening: Recommended annual ophthalmology exams for early detection of glaucoma and other disorders of the eye. Up to date  Dr. Carolee Would you like a referral to an eye doctor? No    Dental Screening: Recommended annual dental exams for proper oral hygiene  Community Resource Referral / Chronic Care Management: CRR required this visit?  No   CCM required this visit?  No   Plan:    I have personally reviewed and noted the following in the patient's chart:   Medical and social history Use of alcohol, tobacco or illicit drugs  Current medications and supplements including opioid prescriptions. Patient is not currently taking opioid prescriptions. Functional ability and status Nutritional status Physical activity Advanced directives List of other physicians Hospitalizations, surgeries, and ER visits in previous 12 months Vitals Screenings to include cognitive, depression, and falls Referrals and appointments  In addition, I have reviewed and discussed with patient certain preventive protocols, quality metrics, and best practice recommendations. A written personalized care plan for preventive services as well as general preventive health recommendations were provided to patient.   Angeline Fredericks, LPN   05/11/7972   After Visit Summary: (MyChart) Due to this being a telephonic visit, the after visit summary with patients personalized plan was offered to patient via MyChart   Notes: Nothing significant to report at this time.

## 2023-10-16 NOTE — Patient Instructions (Addendum)
 Michele [place] , Thank you for taking time out of your busy schedule to complete your Annual Wellness Visit with me. I enjoyed our conversation and look forward to speaking with you again next year. I, as well as your care team,  appreciate your ongoing commitment to your health goals. Please review the following plan we discussed and let me know if I can assist you in the future. Your Game plan/ To Do List    Referrals: If you haven't heard from the office you've been referred to, please reach out to them at the phone provided.   Order has been placed for your Dexa Bone Density. Remember to update your Tetanus (Tdap) vaccine at your pharmacy.  You have an order for:  []   2D Mammogram  []   3D Mammogram  [x]   Bone Density     Please call for appointment:  [facility]  [address] [name] [address] [phone]     Make sure to wear two-piece clothing.  No lotions, powders, or deodorants the day of the appointment. Make sure to bring picture ID and insurance card.  Bring list of medications you are currently taking including any supplements.   Follow up Visits: Next Medicare AWV with our clinical staff: 10/20/24 @ 3:00   Have you seen your provider in the last 6 months (3 months if uncontrolled diabetes)? Yes Next Office Visit with your provider: 02/04/24  Clinician Recommendations:  Aim for 30 minutes of exercise or brisk walking, 6-8 glasses of water, and 5 servings of fruits and vegetables each day.       This is a list of the screening recommended for you and due dates:  Health Maintenance  Topic Date Due   DTaP/Tdap/Td vaccine (1 - Tdap) Never done   DEXA scan (bone density measurement)  Never done   COVID-19 Vaccine (9 - Moderna risk 2024-25 season) 10/21/2023*   Flu Shot  11/09/2023   Mammogram  07/11/2024   Medicare Annual Wellness Visit  10/15/2024   Pneumococcal Vaccine for age over 55  Completed   Zoster  (Shingles) Vaccine  Completed   Hepatitis B Vaccine  Aged Out   HPV Vaccine  Aged Out   Meningitis B Vaccine  Aged Out  *Topic was postponed. The date shown is not the original due date.    Advanced directives: (Copy Requested) Please bring a copy of your health care power of attorney and living will to the office to be added to your chart at your convenience. You can mail to [facility] [address] [place], [place] [address] or email to [email or web address] Advance Care Planning is important because it:  [x]  Makes sure you receive the medical care that is consistent with your values, goals, and preferences  [x]  It provides guidance to your family and loved ones and reduces their decisional burden about whether or not they are making the right decisions based on your wishes.

## 2023-10-22 ENCOUNTER — Other Ambulatory Visit

## 2023-10-24 ENCOUNTER — Telehealth: Payer: Self-pay | Admitting: Internal Medicine

## 2023-10-24 NOTE — Telephone Encounter (Signed)
 Copied from CRM [id number]. Topic: Clinical - Medication Question >> Oct 24, 2023 10:43 AM Mia F wrote: Reason for CRM: Patient called in stating that Dr. Glendia is very familiar with her current situation with her and her family. Pt says she wakes up at 5am every morning and she everything she is going through heavily weighs on her mind. Dr Glendia has offered pt a medication to calm her on a few occasions. Pt said she think that she will like to go ahead and start something because she is getting more anxious each day. Pt would like for the rx to be sent to [facility].

## 2023-10-25 ENCOUNTER — Ambulatory Visit: Admitting: Internal Medicine

## 2023-10-25 NOTE — Telephone Encounter (Signed)
 Pt aware and doing ok. Holding for appointment next week.

## 2023-10-25 NOTE — Telephone Encounter (Signed)
 Please call and let her know that I am out of the office this week. Please call and confirm she is doing ok. If doing ok and no acute issues needing evaluation this week, please hold for Sueanne - will see about work in appt when I return to work.

## 2023-10-29 NOTE — Telephone Encounter (Signed)
 Pt scheduled with Dr Lorin Picket

## 2023-10-30 ENCOUNTER — Ambulatory Visit: Admitting: Internal Medicine

## 2023-10-31 ENCOUNTER — Ambulatory Visit (INDEPENDENT_AMBULATORY_CARE_PROVIDER_SITE_OTHER): Admitting: Internal Medicine

## 2023-10-31 ENCOUNTER — Encounter: Payer: Self-pay | Admitting: Internal Medicine

## 2023-10-31 VITALS — BP 128/70 | HR 72 | Resp 16 | Ht 60.0 in | Wt 114.4 lb

## 2023-10-31 DIAGNOSIS — N183 Chronic kidney disease, stage 3 unspecified: Secondary | ICD-10-CM | POA: Diagnosis not present

## 2023-10-31 DIAGNOSIS — E7800 Pure hypercholesterolemia, unspecified: Secondary | ICD-10-CM | POA: Diagnosis not present

## 2023-10-31 DIAGNOSIS — R5383 Other fatigue: Secondary | ICD-10-CM | POA: Diagnosis not present

## 2023-10-31 DIAGNOSIS — F439 Reaction to severe stress, unspecified: Secondary | ICD-10-CM

## 2023-10-31 DIAGNOSIS — I10 Essential (primary) hypertension: Secondary | ICD-10-CM

## 2023-10-31 MED ORDER — SERTRALINE HCL 25 MG PO TABS: 25.0000 mg | ORAL_TABLET | Freq: Every day | ORAL | 2 refills | Status: DC

## 2023-10-31 NOTE — Progress Notes (Signed)
 Subjective:    Patient ID: Michele [place], female    DOB: 05-08-1932, 89 y.o.   MRN: [id number]  Patient here for  Chief Complaint  Patient presents with   Stress    HPI Here for work in appt. Work in - increased stress. Increased stress with her daughter's health issues and also her husband's health issues. Getting harder for her to do the things to help care for them. She is having problems preparing meals for them and do the things around the house.  Increased stress. She feels she needs something to help level things out. Discussed treatment options.    Past Medical History:  Diagnosis Date   Allergy    Arthritis    GERD (gastroesophageal reflux disease)    Hyperlipidemia    Hypertension    Personal history of colonic polyps    Thyroid  disease    Past Surgical History:  Procedure Laterality Date   APPENDECTOMY  1950   BREAST EXCISIONAL BIOPSY Left 1980   neg   COLONOSCOPY  2011   Family History  Problem Relation Age of Onset   Early death Mother    Diabetes Mother    Cancer Mother    Heart disease Father    Early death Father    Heart attack Father    Diabetes Sister    Early death Brother    Hyperlipidemia Daughter    Depression Daughter    Asthma Daughter    Mental illness Daughter    Breast cancer Neg Hx    Social History   Socioeconomic History   Marital status: Married    Spouse name: Not on file   Number of children: Not on file   Years of education: Not on file   Highest education level: Not on file  Occupational History   Not on file  Tobacco Use   Smoking status: Never   Smokeless tobacco: Never  Substance and Sexual Activity   Alcohol use: No   Drug use: No   Sexual activity: Not on file  Other Topics Concern   Not on file  Social History Narrative   married   Social Drivers of Corporate investment banker Strain: Low Risk  (10/16/2023)   Overall Financial Resource Strain (CARDIA)    Difficulty of Paying Living Expenses: Not hard  at all  Food Insecurity: No Food Insecurity (10/16/2023)   Hunger Vital Sign    Worried About Running Out of Food in the Last Year: Never true    Ran Out of Food in the Last Year: Never true  Transportation Needs: No Transportation Needs (10/16/2023)   PRAPARE - Administrator, Civil Service (Medical): No    Lack of Transportation (Non-Medical): No  Physical Activity: Inactive (10/16/2023)   Exercise Vital Sign    Days of Exercise per Week: 0 days    Minutes of Exercise per Session: 0 min  Stress: No Stress Concern Present (10/16/2023)   [employer] of Occupational Health - Occupational Stress Questionnaire    Feeling of Stress: Only a little  Social Connections: Moderately Isolated (10/16/2023)   Social Connection and Isolation Panel    Frequency of Communication with Friends and Family: Twice a week    Frequency of Social Gatherings with Friends and Family: Twice a week    Attends Religious Services: Never    Database administrator or Organizations: No    Attends Banker Meetings: Never    Marital Status: Married  Review of Systems  Constitutional:  Negative for appetite change and unexpected weight change.  HENT:  Negative for congestion and sinus pressure.   Respiratory:  Negative for cough, chest tightness and shortness of breath.   Cardiovascular:  Negative for chest pain, palpitations and leg swelling.  Gastrointestinal:  Negative for abdominal pain, diarrhea, nausea and vomiting.  Genitourinary:  Negative for difficulty urinating and dysuria.  Musculoskeletal:  Negative for myalgias.  Skin:  Negative for color change and rash.  Neurological:  Negative for dizziness and headaches.  Psychiatric/Behavioral:  Negative for agitation.        Increased stress as outlined.        Objective:     BP 128/70   Pulse 72   Resp 16   Ht 5' (1.524 m)   Wt 114 lb 6.4 oz (51.9 kg)   SpO2 98%   BMI 22.34 kg/m  Wt Readings from Last 3 Encounters:   10/31/23 114 lb 6.4 oz (51.9 kg)  10/16/23 115 lb (52.2 kg)  10/05/23 115 lb (52.2 kg)    Physical Exam Vitals reviewed.  Constitutional:      General: She is not in acute distress.    Appearance: Normal appearance.  HENT:     Head: Normocephalic and atraumatic.     Right Ear: External ear normal.     Left Ear: External ear normal.     Mouth/Throat:     Pharynx: No oropharyngeal exudate or posterior oropharyngeal erythema.  Eyes:     General: No scleral icterus.       Right eye: No discharge.        Left eye: No discharge.     Conjunctiva/sclera: Conjunctivae normal.  Neck:     Thyroid : No thyromegaly.  Cardiovascular:     Rate and Rhythm: Normal rate and regular rhythm.  Pulmonary:     Effort: No respiratory distress.     Breath sounds: Normal breath sounds. No wheezing.  Abdominal:     General: Bowel sounds are normal.     Palpations: Abdomen is soft.     Tenderness: There is no abdominal tenderness.  Musculoskeletal:        General: No swelling or tenderness.     Cervical back: Neck supple. No tenderness.  Lymphadenopathy:     Cervical: No cervical adenopathy.  Skin:    Findings: No erythema or rash.  Neurological:     Mental Status: She is alert.  Psychiatric:        Mood and Affect: Mood normal.        Behavior: Behavior normal.         Outpatient Encounter Medications as of 10/31/2023  Medication Sig   sertraline  (ZOLOFT ) 25 MG tablet Take 1 tablet (25 mg total) by mouth daily.   ASPIRIN 81 PO Take 1 tablet by mouth at bedtime.   famotidine (PEPCID) 20 MG tablet Take 20 mg by mouth daily. (Patient taking differently: Take 20 mg by mouth daily as needed.)   levothyroxine (SYNTHROID, LEVOTHROID) 25 MCG tablet TAKE ONE TABLET BY MOUTH ONCE DAILY. TAKE ON AN EMPTY STOMACH WITH A GLASS OF WATER AT LEAST 30-60 MINUTES BEFORE BREAKFAST   lisinopril-hydrochlorothiazide (PRINZIDE,ZESTORETIC) 20-12.5 MG per tablet Take 1 tablet by mouth daily.   simvastatin  (ZOCOR) 40 MG tablet Take 40 mg by mouth daily.   No facility-administered encounter medications on file as of 10/31/2023.     Lab Results  Component Value Date   WBC 5.0 10/05/2023  HGB 12.1 10/05/2023   HCT 35.8 (L) 10/05/2023   PLT 163.0 10/05/2023   GLUCOSE 104 (H) 10/10/2023   CHOL 134 10/05/2023   TRIG 47.0 10/05/2023   HDL 74.60 10/05/2023   LDLCALC 50 10/05/2023   ALT 15 10/05/2023   AST 31 10/05/2023   NA 134 (L) 10/10/2023   NA 134 (L) 10/10/2023   K 3.9 10/10/2023   CL 97 10/10/2023   CREATININE 1.12 10/10/2023   BUN 15 10/10/2023   CO2 28 10/10/2023   TSH 1.14 10/05/2023   HGBA1C 6.0 10/05/2023    MM 3D DIAGNOSTIC MAMMOGRAM BILATERAL BREAST Result Date: 07/12/2023 CLINICAL DATA:  Follow-up of LEFT breast calcifications EXAM: DIGITAL DIAGNOSTIC BILATERAL MAMMOGRAM WITH TOMOSYNTHESIS AND CAD TECHNIQUE: Bilateral digital diagnostic mammography and breast tomosynthesis was performed. The images were evaluated with computer-aided detection. COMPARISON:  Previous exam(s). ACR Breast Density Category c: The breasts are heterogeneously dense, which may obscure small masses. FINDINGS: Spot magnification views of the LEFT breast demonstrate 2 groups of benign vascular calcifications in the outer breast at middle and posterior depth. No suspicious mass, distortion, or microcalcifications are identified to suggest presence of malignancy bilaterally. IMPRESSION: No mammographic evidence of malignancy bilaterally. RECOMMENDATION: Screening mammogram in one year.(Code:SM-B-01Y) I have discussed the findings and recommendations with the patient. If applicable, a reminder letter will be sent to the patient regarding the next appointment. BI-RADS CATEGORY  2: Benign. Electronically Signed   By: Corean Salter M.D.   On: 07/12/2023 10:51       Assessment & Plan:  Stress Assessment & Plan: Increased stress as outlined. Discussed treatment options.  Trial of low dose zoloft . Follow.  Get her back in soon to reassess.   Orders: -     AMB Referral [facility] Care Management  Stage 3 chronic kidney disease, unspecified whether stage 3a or 3b CKD (HCC) Assessment & Plan: Continue ace inhibitor. Avoid antiinflammatory medication. Discussed staying hydrated. Follow metabolic panel.   Orders: -     AMB Referral [facility] Care Management  Other fatigue Assessment & Plan: Increased fatigue with increased stress. Not able to prepare meals and do as much around the house. Increased stress - treat as outlined. Discussed social work consult - to see what resources are available for help.   Orders: -     AMB Referral [facility] Care Management  Hypercholesteremia Assessment & Plan: Continues on simvastatin. Follow lipid panel and liver function tests. Follow lipid panel.   Orders: -     AMB Referral [facility] Care Management  Hypertension, essential Assessment & Plan: Continue lisinopril/hydrochlorothiazide.  Follow pressures.  No changes in medication today.   Orders: -     AMB Referral [facility] Care Management  Other orders -     Sertraline  HCl; Take 1 tablet (25 mg total) by mouth daily.  Dispense: 30 tablet; Refill: 2     Allena Hamilton, MD

## 2023-11-04 ENCOUNTER — Encounter: Payer: Self-pay | Admitting: Internal Medicine

## 2023-11-04 NOTE — Assessment & Plan Note (Signed)
 Increased stress as outlined. Discussed treatment options.  Trial of low dose zoloft . Follow. Get her back in soon to reassess.

## 2023-11-04 NOTE — Assessment & Plan Note (Signed)
 Continue lisinopril/hydrochlorothiazide.  Follow pressures.  No changes in medication today.

## 2023-11-04 NOTE — Assessment & Plan Note (Signed)
 Increased fatigue with increased stress. Not able to prepare meals and do as much around the house. Increased stress - treat as outlined. Discussed social work consult - to see what resources are available for help.

## 2023-11-04 NOTE — Assessment & Plan Note (Signed)
 Continue ace inhibitor. Avoid antiinflammatory medication. Discussed staying hydrated. Follow metabolic panel.

## 2023-11-04 NOTE — Assessment & Plan Note (Signed)
 Continues on simvastatin. Follow lipid panel and liver function tests. Follow lipid panel.

## 2023-11-23 DIAGNOSIS — D0439 Carcinoma in situ of skin of other parts of face: Secondary | ICD-10-CM | POA: Diagnosis not present

## 2023-11-23 DIAGNOSIS — D2261 Melanocytic nevi of right upper limb, including shoulder: Secondary | ICD-10-CM | POA: Diagnosis not present

## 2023-11-23 DIAGNOSIS — D225 Melanocytic nevi of trunk: Secondary | ICD-10-CM | POA: Diagnosis not present

## 2023-11-23 DIAGNOSIS — Z85828 Personal history of other malignant neoplasm of skin: Secondary | ICD-10-CM | POA: Diagnosis not present

## 2023-11-23 DIAGNOSIS — D2272 Melanocytic nevi of left lower limb, including hip: Secondary | ICD-10-CM | POA: Diagnosis not present

## 2023-11-23 DIAGNOSIS — D2262 Melanocytic nevi of left upper limb, including shoulder: Secondary | ICD-10-CM | POA: Diagnosis not present

## 2023-11-23 DIAGNOSIS — D0472 Carcinoma in situ of skin of left lower limb, including hip: Secondary | ICD-10-CM | POA: Diagnosis not present

## 2023-11-23 DIAGNOSIS — D044 Carcinoma in situ of skin of scalp and neck: Secondary | ICD-10-CM | POA: Diagnosis not present

## 2023-11-23 DIAGNOSIS — D485 Neoplasm of uncertain behavior of skin: Secondary | ICD-10-CM | POA: Diagnosis not present

## 2023-11-28 ENCOUNTER — Telehealth: Payer: Self-pay

## 2023-11-28 NOTE — Progress Notes (Signed)
 Complex Care Management Note  Care Guide Note 11/28/2023 Name: [name] MRN: [id number] DOB: 03-13-1933  Michele Jordan is a 89 y.o. year old female who sees Glendia Shad, MD for primary care. I reached out to Michele Jordan by phone today to offer complex care management services.  [name] was given information about Complex Care Management services today including:   The Complex Care Management services include support from the care team which includes your Nurse Care Manager, Clinical Social Worker, or Pharmacist.  The Complex Care Management team is here to help remove barriers to the health concerns and goals most important to you. Complex Care Management services are voluntary, and the patient may decline or stop services at any time by request to their care team member.   Complex Care Management Consent Status: Patient agreed to services and verbal consent obtained.   Follow up plan:  Telephone appointment with complex care management team member scheduled for:  12/13/23 at 11:00 a.m.   Encounter Outcome:  Patient Scheduled  Dream[facility]  [facility], [facility] VBCI Assistant Direct Dial: [phone]  Fax: [phone]

## 2023-12-05 ENCOUNTER — Ambulatory Visit (INDEPENDENT_AMBULATORY_CARE_PROVIDER_SITE_OTHER): Admitting: Internal Medicine

## 2023-12-05 VITALS — BP 120/70 | HR 75 | Resp 16 | Ht 60.0 in | Wt 112.4 lb

## 2023-12-05 DIAGNOSIS — R739 Hyperglycemia, unspecified: Secondary | ICD-10-CM

## 2023-12-05 DIAGNOSIS — N183 Chronic kidney disease, stage 3 unspecified: Secondary | ICD-10-CM | POA: Diagnosis not present

## 2023-12-05 DIAGNOSIS — E7800 Pure hypercholesterolemia, unspecified: Secondary | ICD-10-CM | POA: Diagnosis not present

## 2023-12-05 DIAGNOSIS — I10 Essential (primary) hypertension: Secondary | ICD-10-CM | POA: Diagnosis not present

## 2023-12-05 DIAGNOSIS — F439 Reaction to severe stress, unspecified: Secondary | ICD-10-CM | POA: Diagnosis not present

## 2023-12-05 MED ORDER — SIMVASTATIN 40 MG PO TABS: 40.0000 mg | ORAL_TABLET | Freq: Every day | ORAL | 3 refills | Status: AC

## 2023-12-05 MED ORDER — LEVOTHYROXINE SODIUM 25 MCG PO TABS: ORAL_TABLET | ORAL | 3 refills | Status: AC

## 2023-12-05 MED ORDER — SERTRALINE HCL 25 MG PO TABS: 25.0000 mg | ORAL_TABLET | Freq: Every day | ORAL | 1 refills | Status: DC

## 2023-12-05 MED ORDER — LISINOPRIL-HYDROCHLOROTHIAZIDE 20-12.5 MG PO TABS: 1.0000 | ORAL_TABLET | Freq: Every day | ORAL | 3 refills | Status: AC

## 2023-12-05 NOTE — Progress Notes (Signed)
 Subjective:    Patient ID: Michele Jordan, female    DOB: 12-07-1932, 89 y.o.   MRN: [id number]  Patient here for  Chief Complaint  Patient presents with   Medical Management of Chronic Issues    HPI Here for a scheduled follow up. F/u regarding increased stress. Started on zoloft . Doing much better. Feels better. Feels the zoloft  is working well for her. She is getting some help with her husband - three days a week for a few hours each day. She reports she is unable to prepare meals now. Social work consulted. Plans to meet soon to discuss assistance. Breathing stable. No cough or congestion. No abdominal pain or bowel change.    Past Medical History:  Diagnosis Date   Allergy    Arthritis    GERD (gastroesophageal reflux disease)    Hyperlipidemia    Hypertension    Personal history of colonic polyps    Thyroid  disease    Past Surgical History:  Procedure Laterality Date   APPENDECTOMY  1950   BREAST EXCISIONAL BIOPSY Left 1980   neg   COLONOSCOPY  2011   Family History  Problem Relation Age of Onset   Early death Mother    Diabetes Mother    Cancer Mother    Heart disease Father    Early death Father    Heart attack Father    Diabetes Sister    Early death Brother    Hyperlipidemia Daughter    Depression Daughter    Asthma Daughter    Mental illness Daughter    Breast cancer Neg Hx    Social History   Socioeconomic History   Marital status: Married    Spouse name: Not on file   Number of children: Not on file   Years of education: Not on file   Highest education level: Not on file  Occupational History   Not on file  Tobacco Use   Smoking status: Never   Smokeless tobacco: Never  Substance and Sexual Activity   Alcohol use: No   Drug use: No   Sexual activity: Not on file  Other Topics Concern   Not on file  Social History Narrative   married   Social Drivers of Corporate investment banker Strain: Low Risk  (10/16/2023)   Overall Financial  Resource Strain (CARDIA)    Difficulty of Paying Living Expenses: Not hard at all  Food Insecurity: No Food Insecurity (10/16/2023)   Hunger Vital Sign    Worried About Running Out of Food in the Last Year: Never true    Ran Out of Food in the Last Year: Never true  Transportation Needs: No Transportation Needs (10/16/2023)   PRAPARE - Administrator, Civil Service (Medical): No    Lack of Transportation (Non-Medical): No  Physical Activity: Inactive (10/16/2023)   Exercise Vital Sign    Days of Exercise per Week: 0 days    Minutes of Exercise per Session: 0 min  Stress: No Stress Concern Present (10/16/2023)   [employer] of Occupational Health - Occupational Stress Questionnaire    Feeling of Stress: Only a little  Social Connections: Moderately Isolated (10/16/2023)   Social Connection and Isolation Panel    Frequency of Communication with Friends and Family: Twice a week    Frequency of Social Gatherings with Friends and Family: Twice a week    Attends Religious Services: Never    Database administrator or Organizations: No  Attends Banker Meetings: Never    Marital Status: Married     Review of Systems  Constitutional:  Negative for appetite change and unexpected weight change.  HENT:  Negative for congestion and sinus pressure.   Respiratory:  Negative for cough, chest tightness and shortness of breath.   Cardiovascular:  Negative for chest pain, palpitations and leg swelling.  Gastrointestinal:  Negative for abdominal pain, diarrhea, nausea and vomiting.  Genitourinary:  Negative for difficulty urinating and dysuria.  Musculoskeletal:  Negative for joint swelling and myalgias.  Skin:  Negative for color change and rash.  Neurological:  Negative for dizziness and headaches.  Psychiatric/Behavioral:  Negative for agitation and dysphoric mood.        Objective:     BP 120/70   Pulse 75   Resp 16   Ht 5' (1.524 m)   Wt 112 lb 6.4 oz (51 kg)    SpO2 98%   BMI 21.95 kg/m  Wt Readings from Last 3 Encounters:  12/05/23 112 lb 6.4 oz (51 kg)  10/31/23 114 lb 6.4 oz (51.9 kg)  10/16/23 115 lb (52.2 kg)    Physical Exam Vitals reviewed.  Constitutional:      General: She is not in acute distress.    Appearance: Normal appearance.  HENT:     Head: Normocephalic and atraumatic.     Right Ear: External ear normal.     Left Ear: External ear normal.     Mouth/Throat:     Pharynx: No oropharyngeal exudate or posterior oropharyngeal erythema.  Eyes:     General: No scleral icterus.       Right eye: No discharge.        Left eye: No discharge.     Conjunctiva/sclera: Conjunctivae normal.  Neck:     Thyroid : No thyromegaly.  Cardiovascular:     Rate and Rhythm: Normal rate and regular rhythm.  Pulmonary:     Effort: No respiratory distress.     Breath sounds: Normal breath sounds. No wheezing.  Abdominal:     General: Bowel sounds are normal.     Palpations: Abdomen is soft.     Tenderness: There is no abdominal tenderness.  Musculoskeletal:        General: No swelling or tenderness.     Cervical back: Neck supple. No tenderness.  Lymphadenopathy:     Cervical: No cervical adenopathy.  Skin:    Findings: No erythema or rash.  Neurological:     Mental Status: She is alert.  Psychiatric:        Mood and Affect: Mood normal.        Behavior: Behavior normal.         Outpatient Encounter Medications as of 12/05/2023  Medication Sig   ASPIRIN 81 PO Take 1 tablet by mouth at bedtime.   famotidine (PEPCID) 20 MG tablet Take 20 mg by mouth daily. (Patient taking differently: Take 20 mg by mouth daily as needed.)   levothyroxine  (SYNTHROID ) 25 MCG tablet TAKE 1 TABLET BY MOUTH ON M,W,F. TAKE 1 1/2 TABLETS BY MOUTH ALL OTHER DAYS.   lisinopril -hydrochlorothiazide  (ZESTORETIC ) 20-12.5 MG tablet Take 1 tablet by mouth daily.   sertraline  (ZOLOFT ) 25 MG tablet Take 1 tablet (25 mg total) by mouth daily.   simvastatin   (ZOCOR ) 40 MG tablet Take 1 tablet (40 mg total) by mouth daily.   [DISCONTINUED] levothyroxine  (SYNTHROID , LEVOTHROID) 25 MCG tablet TAKE ONE TABLET BY MOUTH ONCE DAILY. TAKE ON AN EMPTY STOMACH  WITH A GLASS OF WATER AT LEAST 30-60 MINUTES BEFORE BREAKFAST   [DISCONTINUED] lisinopril -hydrochlorothiazide  (PRINZIDE ,ZESTORETIC ) 20-12.5 MG per tablet Take 1 tablet by mouth daily.   [DISCONTINUED] sertraline  (ZOLOFT ) 25 MG tablet Take 1 tablet (25 mg total) by mouth daily.   [DISCONTINUED] simvastatin  (ZOCOR ) 40 MG tablet Take 40 mg by mouth daily.   No facility-administered encounter medications on file as of 12/05/2023.     Lab Results  Component Value Date   WBC 5.0 10/05/2023   HGB 12.1 10/05/2023   HCT 35.8 (L) 10/05/2023   PLT 163.0 10/05/2023   GLUCOSE 104 (H) 10/10/2023   CHOL 134 10/05/2023   TRIG 47.0 10/05/2023   HDL 74.60 10/05/2023   LDLCALC 50 10/05/2023   ALT 15 10/05/2023   AST 31 10/05/2023   NA 134 (L) 10/10/2023   NA 134 (L) 10/10/2023   K 3.9 10/10/2023   CL 97 10/10/2023   CREATININE 1.12 10/10/2023   BUN 15 10/10/2023   CO2 28 10/10/2023   TSH 1.14 10/05/2023   HGBA1C 6.0 10/05/2023    MM 3D DIAGNOSTIC MAMMOGRAM BILATERAL BREAST Result Date: 07/12/2023 CLINICAL DATA:  Follow-up of LEFT breast calcifications EXAM: DIGITAL DIAGNOSTIC BILATERAL MAMMOGRAM WITH TOMOSYNTHESIS AND CAD TECHNIQUE: Bilateral digital diagnostic mammography and breast tomosynthesis was performed. The images were evaluated with computer-aided detection. COMPARISON:  Previous exam(s). ACR Breast Density Category c: The breasts are heterogeneously dense, which may obscure small masses. FINDINGS: Spot magnification views of the LEFT breast demonstrate 2 groups of benign vascular calcifications in the outer breast at middle and posterior depth. No suspicious mass, distortion, or microcalcifications are identified to suggest presence of malignancy bilaterally. IMPRESSION: No mammographic evidence of  malignancy bilaterally. RECOMMENDATION: Screening mammogram in one year.(Code:SM-B-01Y) I have discussed the findings and recommendations with the patient. If applicable, a reminder letter will be sent to the patient regarding the next appointment. BI-RADS CATEGORY  2: Benign. Electronically Signed   By: Corean Salter M.D.   On: 07/12/2023 10:51       Assessment & Plan:  Stress Assessment & Plan: Doing well on zoloft . Feels better. Continue current medication regimen. No changes today.    Hypercholesteremia Assessment & Plan: Continues on simvastatin . Follow lipid panel and liver function tests.  Follow lipid panel.   Orders: -     Lipid panel; Future -     Hepatic function panel; Future  Hyperglycemia Assessment & Plan: Documented history of elevated blood sugar. Follow met b and A1c.  Lab Results  Component Value Date   HGBA1C 6.0 10/05/2023     Orders: -     Hemoglobin A1c; Future  Hypertension, essential Assessment & Plan: Continue lisinopril /hydrochlorothiazide .  Follow pressures.  No changres in medications today. Follow.   Orders: -     Basic metabolic panel with GFR; Future  Stage 3 chronic kidney disease, unspecified whether stage 3a or 3b CKD (HCC) Assessment & Plan: Continue ace inhibitor. Avoid antiinflammatory medication. Stay hydrated. Follow metabolic panel.    Other orders -     Sertraline  HCl; Take 1 tablet (25 mg total) by mouth daily.  Dispense: 90 tablet; Refill: 1 -     Lisinopril -hydroCHLOROthiazide ; Take 1 tablet by mouth daily.  Dispense: 90 tablet; Refill: 3 -     Simvastatin ; Take 1 tablet (40 mg total) by mouth daily.  Dispense: 90 tablet; Refill: 3 -     Levothyroxine  Sodium; TAKE 1 TABLET BY MOUTH ON M,W,F. TAKE 1 1/2 TABLETS BY MOUTH ALL OTHER  DAYS.  Dispense: 115 tablet; Refill: 3     Allena Hamilton, MD

## 2023-12-10 ENCOUNTER — Encounter: Payer: Self-pay | Admitting: Internal Medicine

## 2023-12-10 NOTE — Assessment & Plan Note (Signed)
 Doing well on zoloft . Feels better. Continue current medication regimen. No changes today.

## 2023-12-10 NOTE — Assessment & Plan Note (Signed)
 Continue ace inhibitor. Avoid antiinflammatory medication. Stay hydrated. Follow metabolic panel.

## 2023-12-10 NOTE — Assessment & Plan Note (Signed)
 Continue lisinopril /hydrochlorothiazide .  Follow pressures.  No changres in medications today. Follow.

## 2023-12-10 NOTE — Assessment & Plan Note (Signed)
 Continues on simvastatin. Follow lipid panel and liver function tests. Follow lipid panel.

## 2023-12-10 NOTE — Assessment & Plan Note (Signed)
 Documented history of elevated blood sugar. Follow met b and A1c.  Lab Results  Component Value Date   HGBA1C 6.0 10/05/2023

## 2023-12-13 ENCOUNTER — Other Ambulatory Visit: Payer: Self-pay | Admitting: *Deleted

## 2023-12-14 NOTE — Addendum Note (Signed)
 Addended by: ERMALINDA LENN HERO on: 12/14/2023 07:49 PM   Modules accepted: Orders, Level of Service

## 2023-12-14 NOTE — Progress Notes (Signed)
 This encounter was created in error - please disregard.

## 2023-12-14 NOTE — Patient Instructions (Signed)
 Visit Information  Thank you for taking time to visit with me today. Please don't hesitate to contact me if I can be of assistance to you before our next scheduled appointment.  Our next appointment is by telephone on 12/28/23 at 3pm Please call the care guide team at [phone] if you need to cancel or reschedule your appointment.   Following is a copy of your care plan:   Goals Addressed             This Visit's Progress    VBCI Social Work Care Plan       Problems:   Lacks knowledge of how to connect to community resources  CSW Clinical Goal(s):   Over the next 90 days the Patient will will follow up with Meals on Wheels  as directed by Social Work.to follow up on referral placed.  Interventions:   Active listening / Reflection utilized Caregiver stress acknowledged :prioritizing self care emphasized  Discussed caregiver resources and support: confirmed patient's spouse has Aid and Attendance 11 hours per week, transportation to medical appointments provided by son   Solution-Focused Strategies employed: Meals on Wheels referral completed  Patient Goals/Self-Care Activities:  Follow up with [employer] Whe[employer] regarding status of Meals on Wheels referral  Plan:   Telephone follow up appointment with care management team member scheduled for:  12/28/23 3pm        Please call 911 if you are experiencing a Mental Health or Behavioral Health Crisis or need someone to talk to.  Patient verbalizes understanding of instructions and care plan provided today and agrees to view in MyChart. Active MyChart status and patient understanding of how to access instructions and care plan via MyChart confirmed with patient.     Egypt Marchiano, LCSW [place]  [facility], [facility] Licensed Clinical Social Worker  Direct Dial: [phone]

## 2023-12-14 NOTE — Patient Outreach (Addendum)
 Complex Care Management   Visit Note  12/14/2023  Name:  Michele Jordan MRN: [id number] DOB: 25-Jun-1932  Situation: Referral received for Complex Care Management related to caregiver stress I obtained verbal consent from Patient.  Visit completed with Patient  on the phone on 12/13/23.  Background:   Past Medical History:  Diagnosis Date   Allergy    Arthritis    GERD (gastroesophageal reflux disease)    Hyperlipidemia    Hypertension    Personal history of colonic polyps    Thyroid  disease     Assessment: Patient Reported Symptoms:  Cognitive Cognitive Status: Alert and oriented to person, place, and time, Normal speech and language skills, Insightful and able to interpret abstract concepts Cognitive/Intellectual Conditions Management [RPT]: None reported or documented in medical history or problem list   Health Maintenance Behaviors: Annual physical exam Healing Pattern: Average Health Facilitated by: Stress management, Prayer/meditation  Neurological Neurological Review of Symptoms: No symptoms reported    HEENT   HEENT Comment: sinus headaches once in a while, not often    Cardiovascular Cardiovascular Symptoms Reported: No symptoms reported    Respiratory Respiratory Symptoms Reported: No symptoms reported    Endocrine Endocrine Symptoms Reported: No symptoms reported Is patient diabetic?: No    Gastrointestinal Gastrointestinal Symptoms Reported: No symptoms reported      Genitourinary Genitourinary Symptoms Reported: No symptoms reported    Integumentary Integumentary Symptoms Reported: Skin changes Additional Integumentary Details: place on top of her head that is scaly,  referred to a specialist in [facility] 12/26/23 has a mile on ankle and temple(treating with some cream) Skin Management Strategies: Routine screening Skin Self-Management Outcome: 4 (good)  Musculoskeletal Musculoskelatal Symptoms Reviewed: Back pain Additional Musculoskeletal Details: hip  and back pain because of my short leg-has a wedge insert in my shoe-patinent also discussed haivng arthritis in thumbs Musculoskeletal Management Strategies: Routine screening Falls in the past year?: No    Psychosocial Psychosocial Symptoms Reported: No symptoms reported Additional Psychological Details: patient is the main caregiver for spouse-requesting referral for meals on wheels -states  that due to arthritis in her thumbs it is hard for her to hold on to utensils to to prepare meals. Comfirms that spouse  receives AId and Attendance through the [place] and receives 11 hours per week but they cannot cook a meal 11-2:30 tue, wed, fri   Major Change/Loss/Stressor/Fears (CP): Medical condition, family Quality of Family Relationships: supportive Do you feel physically threatened by others?: No    12/14/2023    PHQ2-9 Depression Screening   Little interest or pleasure in doing things Several days  Feeling down, depressed, or hopeless Not at all  PHQ-2 - Total Score 1  Trouble falling or staying asleep, or sleeping too much    Feeling tired or having little energy    Poor appetite or overeating     Feeling bad about yourself - or that you are a failure or have let yourself or your family down    Trouble concentrating on things, such as reading the newspaper or watching television    Moving or speaking so slowly that other people could have noticed.  Or the opposite - being so fidgety or restless that you have been moving around a lot more than usual    Thoughts that you would be better off dead, or hurting yourself in some way    PHQ2-9 Total Score    If you checked off any problems, how difficult have these problems made it for  you to do your work, take care of things at home, or get along with other people    Depression Interventions/Treatment      There were no vitals filed for this visit.  Medications Reviewed Today     Reviewed by Ermalinda Lenn HERO, LCSW (Social Worker) on 12/13/23 at  1123  Med List Status: <None>   Medication Order Taking? Sig Documenting Provider Last Dose Status Informant  ASPIRIN 81 PO [id number] Yes Take 1 tablet by mouth at bedtime. [provider]  Active   famotidine (PEPCID) 20 MG tablet [id number] Yes Take 20 mg by mouth daily.  Patient taking differently: Take 20 mg by mouth daily as needed.   [provider]  Active   levothyroxine  (SYNTHROID ) 25 MCG tablet [id number] Yes TAKE 1 TABLET BY MOUTH ON M,W,F. TAKE 1 1/2 TABLETS BY MOUTH ALL OTHER DAYS. Glendia Shad, MD  Active   lisinopril -hydrochlorothiazide  (ZESTORETIC ) 20-12.5 MG tablet [id number] Yes Take 1 tablet by mouth daily. Glendia Shad, MD  Active   sertraline  (ZOLOFT ) 25 MG tablet [id number] Yes Take 1 tablet (25 mg total) by mouth daily. Glendia Shad, MD  Active   simvastatin  (ZOCOR ) 40 MG tablet [id number] Yes Take 1 tablet (40 mg total) by mouth daily. Glendia Shad, MD  Active             Recommendation:   PCP Follow-up Referral made to Meals on Wheels  Follow Up Plan:   Telephone follow up appointment date/time:  12/28/23 3pm  Cheri Ayotte, LCSW [place]  [facility], [facility] Licensed Clinical Social Worker  Direct Dial: [phone]

## 2023-12-14 NOTE — Patient Outreach (Deleted)
 Complex Care Management   Visit Note  12/14/2023  Name:  Michele Jordan MRN: [id number] DOB: 1932/06/02  Situation: Referral received for Complex Care Management related to {Criteria:32550} I obtained verbal consent from {CHL AMB Patient/Caregiver:28184}.  Visit completed with {CHL AMB Patient/Caregiver:28184}  {VISIT LOCATION:32553}  Background:   Past Medical History:  Diagnosis Date   Allergy    Arthritis    GERD (gastroesophageal reflux disease)    Hyperlipidemia    Hypertension    Personal history of colonic polyps    Thyroid  disease     Assessment: Patient Reported Symptoms:  Cognitive Cognitive Status: Alert and oriented to person, place, and time, Normal speech and language skills, Insightful and able to interpret abstract concepts Cognitive/Intellectual Conditions Management [RPT]: None reported or documented in medical history or problem list   Health Maintenance Behaviors: Annual physical exam Healing Pattern: Average Health Facilitated by: Stress management, Prayer/meditation  Neurological Neurological Review of Symptoms: No symptoms reported    HEENT   HEENT Comment: sinus headaches once in a while, not often    Cardiovascular Cardiovascular Symptoms Reported: No symptoms reported    Respiratory      Endocrine Endocrine Symptoms Reported: No symptoms reported Is patient diabetic?: No    Gastrointestinal Gastrointestinal Symptoms Reported: No symptoms reported      Genitourinary      Integumentary Integumentary Symptoms Reported: Skin changes Additional Integumentary Details: place on top of her head that is scaly,  referred to a specialist in [facility] 12/26/23 has a mile on ankle and temple(treating with some cream) Skin Management Strategies: Routine screening Skin Self-Management Outcome: 4 (good)  Musculoskeletal Musculoskelatal Symptoms Reviewed: Back pain Additional Musculoskeletal Details: hip and back pain because of my short leg-has a wedge  insert in my shoe Musculoskeletal Management Strategies: Routine screening Falls in the past year?: No    Psychosocial       Quality of Family Relationships: supportive Do you feel physically threatened by others?: No    12/14/2023    PHQ2-9 Depression Screening   Little interest or pleasure in doing things Several days  Feeling down, depressed, or hopeless Not at all  PHQ-2 - Total Score 1  Trouble falling or staying asleep, or sleeping too much    Feeling tired or having little energy    Poor appetite or overeating     Feeling bad about yourself - or that you are a failure or have let yourself or your family down    Trouble concentrating on things, such as reading the newspaper or watching television    Moving or speaking so slowly that other people could have noticed.  Or the opposite - being so fidgety or restless that you have been moving around a lot more than usual    Thoughts that you would be better off dead, or hurting yourself in some way    PHQ2-9 Total Score    If you checked off any problems, how difficult have these problems made it for you to do your work, take care of things at home, or get along with other people    Depression Interventions/Treatment      There were no vitals filed for this visit.  Medications Reviewed Today     Reviewed by Ermalinda Lenn HERO, LCSW (Social Worker) on 12/13/23 at 1123  Med List Status: <None>   Medication Order Taking? Sig Documenting Provider Last Dose Status Informant  ASPIRIN 81 PO [id number] Yes Take 1 tablet by mouth at bedtime. [provider]  Active   famotidine (PEPCID) 20 MG tablet [id number] Yes Take 20 mg by mouth daily.  Patient taking differently: Take 20 mg by mouth daily as needed.   [provider]  Active   levothyroxine  (SYNTHROID ) 25 MCG tablet [id number] Yes TAKE 1 TABLET BY MOUTH ON M,W,F. TAKE 1 1/2 TABLETS BY MOUTH ALL OTHER DAYS. Glendia Shad, MD  Active   lisinopril -hydrochlorothiazide   (ZESTORETIC ) 20-12.5 MG tablet [id number] Yes Take 1 tablet by mouth daily. Glendia Shad, MD  Active   sertraline  (ZOLOFT ) 25 MG tablet [id number] Yes Take 1 tablet (25 mg total) by mouth daily. Glendia Shad, MD  Active   simvastatin  (ZOCOR ) 40 MG tablet [id number] Yes Take 1 tablet (40 mg total) by mouth daily. Glendia Shad, MD  Active             Recommendation:   {RECOMMENDATONS:32554}  Follow Up Plan:   {FOLLOWUP:32559}  SIG ***

## 2023-12-26 DIAGNOSIS — D044 Carcinoma in situ of skin of scalp and neck: Secondary | ICD-10-CM | POA: Diagnosis not present

## 2023-12-28 ENCOUNTER — Other Ambulatory Visit: Payer: Self-pay | Admitting: *Deleted

## 2023-12-28 NOTE — Patient Instructions (Signed)
 Visit Information  Thank you for taking time to visit with me today. Please don't hesitate to contact me if I can be of assistance to you before our next scheduled appointment Your next care management appointment is by telephone on 01/17/24 at 1:30pm   Please call the care guide team at [phone] if you need to cancel, schedule, or reschedule an appointment.   Please call 911 if you are experiencing a Mental Health or Behavioral Health Crisis or need someone to talk to.  Nykole Matos, LCSW [place]  [facility], [facility] Licensed Clinical Social Worker  Direct Dial: [phone]

## 2023-12-28 NOTE — Patient Outreach (Addendum)
 Complex Care Management   Visit Note  12/28/2023  Name:  Michele Jordan MRN: [id number] DOB: 1932/09/21  Situation: Referral received for Complex Care Management related to caregiver stress I obtained verbal consent from Patient.  Visit completed with Patient  on the phone on.  Background:   Past Medical History:  Diagnosis Date   Allergy    Arthritis    GERD (gastroesophageal reflux disease)    Hyperlipidemia    Hypertension    Personal history of colonic polyps    Thyroid  disease     Assessment: Patient Reported Symptoms:  Cognitive Cognitive Status: Alert and oriented to person, place, and time, Normal speech and language skills, Insightful and able to interpret abstract concepts Cognitive/Intellectual Conditions Management [RPT]: None reported or documented in medical history or problem list   Health Maintenance Behaviors: Annual physical exam Healing Pattern: Average Health Facilitated by: Stress management, Prayer/meditation  Neurological Neurological Review of Symptoms: No symptoms reported    HEENT HEENT Symptoms Reported: No symptoms reported      Cardiovascular Cardiovascular Symptoms Reported: No symptoms reported    Respiratory Respiratory Symptoms Reported: No symptoms reported    Endocrine Endocrine Symptoms Reported: No symptoms reported    Gastrointestinal Gastrointestinal Symptoms Reported: No symptoms reported      Genitourinary Genitourinary Symptoms Reported: No symptoms reported    Integumentary Integumentary Symptoms Reported: Skin changes Additional Integumentary Details: recent visit to the skin surgery center in [place]-large spot removed for an biopsy Skin Management Strategies: Routine screening  Musculoskeletal Musculoskelatal Symptoms Reviewed: Back pain Additional Musculoskeletal Details: continued hip and back pain-arthritis in thumbs Musculoskeletal Management Strategies: Routine screening      Psychosocial Psychosocial Symptoms  Reported: No symptoms reported Additional Psychological Details: referral for meals on wheels completed-meals would be 6.75 per meal-sp[facility] per [facility]) referral completed to [place] soup meals Behavioral Management Strategies: Coping strategies Behavioral Health Self-Management Outcome: 4 (good) Major Change/Loss/Stressor/Fears (CP): Medical condition, self, Medical condition, family Techniques to [place] with Loss/Stress/Change: Diversional activities Quality of Family Relationships: supportive Do you feel physically threatened by others?: No    12/28/2023    PHQ2-9 Depression Screening   Little interest or pleasure in doing things    Feeling down, depressed, or hopeless    PHQ-2 - Total Score    Trouble falling or staying asleep, or sleeping too much    Feeling tired or having little energy    Poor appetite or overeating     Feeling bad about yourself - or that you are a failure or have let yourself or your family down    Trouble concentrating on things, such as reading the newspaper or watching television    Moving or speaking so slowly that other people could have noticed.  Or the opposite - being so fidgety or restless that you have been moving around a lot more than usual    Thoughts that you would be better off dead, or hurting yourself in some way    PHQ2-9 Total Score    If you checked off any problems, how difficult have these problems made it for you to do your work, take care of things at home, or get along with other people    Depression Interventions/Treatment      There were no vitals filed for this visit.  Medications Reviewed Today     Reviewed by Ermalinda Lenn HERO, LCSW (Social Worker) on 12/28/23 at 1523  Med List Status: <  None>   Medication Order Taking? Sig Documenting Provider Last Dose Status Informant  ASPIRIN 81 PO [id number]  Take 1 tablet by mouth at bedtime. [provider]  Active    famotidine (PEPCID) 20 MG tablet [id number]  Take 20 mg by mouth daily.  Patient taking differently: Take 20 mg by mouth daily as needed.   [provider]  Active   levothyroxine  (SYNTHROID ) 25 MCG tablet [id number]  TAKE 1 TABLET BY MOUTH ON M,W,F. TAKE 1 1/2 TABLETS BY MOUTH ALL OTHER DAYS. Glendia Shad, MD  Active   lisinopril -hydrochlorothiazide  (ZESTORETIC ) 20-12.5 MG tablet [id number]  Take 1 tablet by mouth daily. Glendia Shad, MD  Active   sertraline  (ZOLOFT ) 25 MG tablet [id number]  Take 1 tablet (25 mg total) by mouth daily. Glendia Shad, MD  Active   simvastatin  (ZOCOR ) 40 MG tablet [id number]  Take 1 tablet (40 mg total) by mouth daily. Glendia Shad, MD  Active             Recommendation:   PCP Follow-up 03/11/24 Specialty provider follow-up as scheduled AWV 10/21/23  Follow Up Plan:   Telephone follow up appointment date/time:  01/17/24  Lenn Mean, LCSW [place]  [facility], [facility] Licensed Clinical Social Worker  Direct Dial: [phone]

## 2024-01-17 ENCOUNTER — Other Ambulatory Visit: Payer: Self-pay | Admitting: *Deleted

## 2024-01-18 NOTE — Patient Outreach (Cosign Needed)
 Complex Care Management   Visit Note  01/18/2024  Name:  Michele Jordan MRN: [id number] DOB: 1932-06-15  Situation: Referral received for Complex Care Management related to caregiver stress Patient confirmed that Meals on Wheels and in home care assistance from the [place] has proven to be beneficial. I obtained verbal consent from Patient. Visit completed with Patient on the phone on on 01/17/24. Background:   Past Medical History:  Diagnosis Date   Allergy    Arthritis    GERD (gastroesophageal reflux disease)    Hyperlipidemia    Hypertension    Personal history of colonic polyps    Thyroid  disease     Assessment: Patient Reported Symptoms:  Cognitive Cognitive Status: Alert and oriented to person, place, and time, Normal speech and language skills, Insightful and able to interpret abstract concepts Cognitive/Intellectual Conditions Management [RPT]: None reported or documented in medical history or problem list   Health Maintenance Behaviors: Annual physical exam Healing Pattern: Average Health Facilitated by: Stress management, Prayer/meditation  Neurological Neurological Review of Symptoms: No symptoms reported    HEENT HEENT Symptoms Reported: No symptoms reported      Cardiovascular Cardiovascular Symptoms Reported: No symptoms reported    Respiratory Respiratory Symptoms Reported: No symptoms reported    Endocrine Endocrine Symptoms Reported: No symptoms reported Is patient diabetic?: No    Gastrointestinal Gastrointestinal Symptoms Reported: No symptoms reported      Genitourinary Genitourinary Symptoms Reported: No symptoms reported    Integumentary Integumentary Symptoms Reported: Skin changes Additional Integumentary Details: [facility] in [place] removed large spot  on head for an biopsy-per patient results all clear stitches will disolve on their own Skin Management Strategies: Coping strategies Skin Self-Management Outcome: 4 (good) Skin Comment:  No further follow up needed  Musculoskeletal Musculoskelatal Symptoms Reviewed: Back pain, Muscle pain Additional Musculoskeletal Details: continued hip and back paint-arthritis in thumbs-now has meals on wheels to avoid cooking -sleeps with arthritis gloves Musculoskeletal Management Strategies: Routine screening      Psychosocial Psychosocial Symptoms Reported: No symptoms reported Additional Psychological Details: confirmed that meals on wheels has started-enjoying meals provided-[facility] continues to assist with spouses daily care-4 days per week 3 hours each visit Behavioral Management Strategies: Coping strategies Behavioral Health Self-Management Outcome: 4 (good) Behavioral Health Comment: zoloft  taken daily, helps with feeling overwhelmed Major Change/Loss/Stressor/Fears (CP): Medical condition, self, Medical condition, family Techniques to [place] with Loss/Stress/Change: Diversional activities Quality of Family Relationships: supportive Do you feel physically threatened by others?: No    01/18/2024    PHQ2-9 Depression Screening   Little interest or pleasure in doing things Not at all  Feeling down, depressed, or hopeless Not at all  PHQ-2 - Total Score 0  Trouble falling or staying asleep, or sleeping too much    Feeling tired or having little energy    Poor appetite or overeating     Feeling bad about yourself - or that you are a failure or have let yourself or your family down    Trouble concentrating on things, such as reading the newspaper or watching television    Moving or speaking so slowly that other people could have noticed.  Or the opposite - being so fidgety or restless that you have been moving around a lot more than usual    Thoughts that you would be better off dead, or hurting yourself in some way    PHQ2-9 Total Score    If you checked off any problems, how difficult have  these problems made it for you to do your work, take care of things at home, or  get along with other people    Depression Interventions/Treatment      There were no vitals filed for this visit.  Medications Reviewed Today     Reviewed by Ermalinda Lenn HERO, LCSW (Social Worker) on 01/18/24 at 0825  Med List Status: <None>   Medication Order Taking? Sig Documenting Provider Last Dose Status Informant  ASPIRIN 81 PO [id number] Yes Take 1 tablet by mouth at bedtime. [provider]  Active   famotidine (PEPCID) 20 MG tablet [id number] Yes Take 20 mg by mouth daily.  Patient taking differently: Take 20 mg by mouth daily as needed.   [provider]  Active   levothyroxine  (SYNTHROID ) 25 MCG tablet [id number] Yes TAKE 1 TABLET BY MOUTH ON M,W,F. TAKE 1 1/2 TABLETS BY MOUTH ALL OTHER DAYS. Glendia Shad, MD  Active   lisinopril -hydrochlorothiazide  (ZESTORETIC ) 20-12.5 MG tablet [id number] Yes Take 1 tablet by mouth daily. Glendia Shad, MD  Active   sertraline  (ZOLOFT ) 25 MG tablet [id number] Yes Take 1 tablet (25 mg total) by mouth daily. Glendia Shad, MD  Active   simvastatin  (ZOCOR ) 40 MG tablet [id number]  Take 1 tablet (40 mg total) by mouth daily.  Patient not taking: Reported on 01/17/2024   Glendia Shad, MD  Active             Recommendation:   PCP Follow-up 03/11/24 AWV 10/20/24  Follow Up Plan:   Closing From:  Complex Care Management  Gwen Sarvis, LCSW [place]  [facility], [facility] Licensed Clinical Social Worker  Direct Dial: [phone]

## 2024-01-18 NOTE — Patient Instructions (Signed)
 Visit Information  Thank you for taking time to visit with me today. Please don't hesitate to contact me if I can be of assistance to you before our next scheduled appointment.  Your next care management appointment is no further scheduled appointments.   Closing From: Complex Care Management.  Please call the care guide team at [phone] if you need to cancel, schedule, or reschedule an appointment.   Please call 911 if you are experiencing a Mental Health or Behavioral Health Crisis or need someone to talk to.  Jaquarious Grey, LCSW [place]  [facility], [facility] Health Licensed Clinical Social Worker  Direct Dial: [phone]

## 2024-01-30 ENCOUNTER — Other Ambulatory Visit

## 2024-01-30 ENCOUNTER — Telehealth: Payer: Self-pay | Admitting: Internal Medicine

## 2024-01-30 NOTE — Telephone Encounter (Signed)
 Pt stopped in not realizing lab visit had been canceled for today. She's asking if she will need labs before visit in December. Please reach out to her

## 2024-01-30 NOTE — Telephone Encounter (Signed)
 Please see me about this. Just need to clarify why her lab appt was scheduled. It also appears she had an appt scheduled with me 02/04/24. Did she cancel this appt?  I can get her in for lab appt and f/u appt earlier if needs to be seen.

## 2024-01-31 NOTE — Telephone Encounter (Signed)
 Called pt to clarify 4 follow up appt on 02/04/2024 and fasting labs on 01/30/2024 was canceled pt not sure why these appts were canceled. She stated she is feeling good and doesn't need to be seen any earlier then the appt that is currently scheduled

## 2024-02-04 ENCOUNTER — Ambulatory Visit: Admitting: Internal Medicine

## 2024-02-05 ENCOUNTER — Other Ambulatory Visit (INDEPENDENT_AMBULATORY_CARE_PROVIDER_SITE_OTHER)

## 2024-02-05 DIAGNOSIS — I10 Essential (primary) hypertension: Secondary | ICD-10-CM

## 2024-02-05 DIAGNOSIS — R739 Hyperglycemia, unspecified: Secondary | ICD-10-CM

## 2024-02-05 DIAGNOSIS — E7800 Pure hypercholesterolemia, unspecified: Secondary | ICD-10-CM | POA: Diagnosis not present

## 2024-02-05 LAB — BASIC METABOLIC PANEL WITH GFR
BUN: 20 mg/dL (ref 6–23)
CO2: 28 meq/L (ref 19–32)
Calcium: 9.9 mg/dL (ref 8.4–10.5)
Chloride: 96 meq/L (ref 96–112)
Creatinine, Ser: 1.07 mg/dL (ref 0.40–1.20)
GFR: 46.17 mL/min — ABNORMAL LOW (ref >60.00)
Glucose, Bld: 103 mg/dL — ABNORMAL HIGH (ref 70–99)
Potassium: 4.3 meq/L (ref 3.5–5.1)
Sodium: 132 meq/L — ABNORMAL LOW (ref 135–145)

## 2024-02-05 LAB — HEPATIC FUNCTION PANEL
ALT: 15 U/L (ref 0–35)
AST: 30 U/L (ref 0–37)
Albumin: 4.1 g/dL (ref 3.5–5.2)
Alkaline Phosphatase: 50 U/L (ref 39–117)
Bilirubin, Direct: 0.2 mg/dL (ref 0.0–0.3)
Total Bilirubin: 0.8 mg/dL (ref 0.2–1.2)
Total Protein: 6.4 g/dL (ref 6.0–8.3)

## 2024-02-05 LAB — LIPID PANEL
Cholesterol: 129 mg/dL (ref 0–200)
HDL: 74.2 mg/dL (ref >39.00)
LDL Cholesterol: 41 mg/dL (ref 0–99)
NonHDL: 55.26
Total CHOL/HDL Ratio: 2
Triglycerides: 69 mg/dL (ref 0.0–149.0)
VLDL: 13.8 mg/dL (ref 0.0–40.0)

## 2024-02-05 LAB — HEMOGLOBIN A1C: Hgb A1c MFr Bld: 6 % (ref 4.6–6.5)

## 2024-02-06 DIAGNOSIS — D0472 Carcinoma in situ of skin of left lower limb, including hip: Secondary | ICD-10-CM | POA: Diagnosis not present

## 2024-02-06 DIAGNOSIS — C44329 Squamous cell carcinoma of skin of other parts of face: Secondary | ICD-10-CM | POA: Diagnosis not present

## 2024-02-07 ENCOUNTER — Ambulatory Visit: Payer: Self-pay | Admitting: Internal Medicine

## 2024-02-08 ENCOUNTER — Other Ambulatory Visit: Payer: Self-pay

## 2024-02-08 DIAGNOSIS — E871 Hypo-osmolality and hyponatremia: Secondary | ICD-10-CM

## 2024-02-18 ENCOUNTER — Other Ambulatory Visit

## 2024-02-18 DIAGNOSIS — E871 Hypo-osmolality and hyponatremia: Secondary | ICD-10-CM | POA: Diagnosis not present

## 2024-02-18 LAB — SODIUM: Sodium: 134 meq/L — ABNORMAL LOW (ref 135–145)

## 2024-02-19 ENCOUNTER — Ambulatory Visit: Payer: Self-pay | Admitting: Internal Medicine

## 2024-02-19 NOTE — Progress Notes (Signed)
 Patient was notified of recent lab results and recommendations. Patient verbalized understanding and has no further questions.

## 2024-03-11 ENCOUNTER — Ambulatory Visit: Admitting: Internal Medicine

## 2024-03-11 ENCOUNTER — Encounter: Payer: Self-pay | Admitting: Internal Medicine

## 2024-03-11 VITALS — BP 122/72 | HR 61 | Temp 98.6°F | Ht 60.0 in | Wt 109.2 lb

## 2024-03-11 DIAGNOSIS — F439 Reaction to severe stress, unspecified: Secondary | ICD-10-CM

## 2024-03-11 DIAGNOSIS — E7800 Pure hypercholesterolemia, unspecified: Secondary | ICD-10-CM

## 2024-03-11 DIAGNOSIS — M810 Age-related osteoporosis without current pathological fracture: Secondary | ICD-10-CM | POA: Diagnosis not present

## 2024-03-11 DIAGNOSIS — I10 Essential (primary) hypertension: Secondary | ICD-10-CM | POA: Diagnosis not present

## 2024-03-11 DIAGNOSIS — E2839 Other primary ovarian failure: Secondary | ICD-10-CM

## 2024-03-11 DIAGNOSIS — N183 Chronic kidney disease, stage 3 unspecified: Secondary | ICD-10-CM | POA: Diagnosis not present

## 2024-03-11 DIAGNOSIS — R739 Hyperglycemia, unspecified: Secondary | ICD-10-CM | POA: Diagnosis not present

## 2024-03-11 MED ORDER — SERTRALINE HCL 25 MG PO TABS: 25.0000 mg | ORAL_TABLET | Freq: Every day | ORAL | 1 refills | Status: AC

## 2024-03-11 NOTE — Progress Notes (Signed)
 Subjective:    Patient ID: Michele Jordan, female    DOB: 1932-08-21, 89 y.o.   MRN: [id number]  Patient here for  Chief Complaint  Patient presents with   Medical Management of Chronic Issues    HPI Here for a scheduled follow up - follow up regarding increased stress. Continues on zoloft . Doing well on zoloft . Feels better. Receiving meals on wheels. This has helped her stress. Tries to stay active. No chest pain or sob reported. No abdominal pain or bowel change reported.    Past Medical History:  Diagnosis Date   Allergy    Arthritis    GERD (gastroesophageal reflux disease)    Hyperlipidemia    Hypertension    Personal history of colonic polyps    Thyroid  disease    Past Surgical History:  Procedure Laterality Date   APPENDECTOMY  1950   BREAST EXCISIONAL BIOPSY Left 1980   neg   COLONOSCOPY  2011   Family History  Problem Relation Age of Onset   Early death Mother    Diabetes Mother    Cancer Mother    Heart disease Father    Early death Father    Heart attack Father    Diabetes Sister    Early death Brother    Hyperlipidemia Daughter    Depression Daughter    Asthma Daughter    Mental illness Daughter    Breast cancer Neg Hx    Social History   Socioeconomic History   Marital status: Married    Spouse name: Not on file   Number of children: Not on file   Years of education: Not on file   Highest education level: Not on file  Occupational History   Not on file  Tobacco Use   Smoking status: Never   Smokeless tobacco: Never  Substance and Sexual Activity   Alcohol use: No   Drug use: No   Sexual activity: Not on file  Other Topics Concern   Not on file  Social History Narrative   married   Social Drivers of Corporate Investment Banker Strain: Low Risk  (10/16/2023)   Overall Financial Resource Strain (CARDIA)    Difficulty of Paying Living Expenses: Not hard at all  Food Insecurity: No Food Insecurity (12/13/2023)   Hunger Vital Sign     Worried About Running Out of Food in the Last Year: Never true    Ran Out of Food in the Last Year: Never true  Transportation Needs: No Transportation Needs (12/13/2023)   PRAPARE - Administrator, Civil Service (Medical): No    Lack of Transportation (Non-Medical): No  Physical Activity: Inactive (10/16/2023)   Exercise Vital Sign    Days of Exercise per Week: 0 days    Minutes of Exercise per Session: 0 min  Stress: No Stress Concern Present (10/16/2023)   [employer] of Occupational Health - Occupational Stress Questionnaire    Feeling of Stress: Only a little  Social Connections: Moderately Isolated (10/16/2023)   Social Connection and Isolation Panel    Frequency of Communication with Friends and Family: Twice a week    Frequency of Social Gatherings with Friends and Family: Twice a week    Attends Religious Services: Never    Database Administrator or Organizations: No    Attends Engineer, Structural: Never    Marital Status: Married     Review of Systems  Constitutional:  Negative for appetite change and unexpected  weight change.  HENT:  Negative for congestion and sinus pressure.   Respiratory:  Negative for cough, chest tightness and shortness of breath.   Cardiovascular:  Negative for chest pain, palpitations and leg swelling.  Gastrointestinal:  Negative for abdominal pain, diarrhea, nausea and vomiting.  Genitourinary:  Negative for difficulty urinating and dysuria.  Musculoskeletal:  Negative for joint swelling and myalgias.  Skin:  Negative for color change and rash.  Neurological:  Negative for dizziness and headaches.  Psychiatric/Behavioral:  Negative for agitation and dysphoric mood.        Objective:     BP 122/72   Pulse 61   Temp 98.6 F (37 C) (Oral)   Ht 5' (1.524 m)   Wt 109 lb 3.2 oz (49.5 kg)   SpO2 97%   BMI 21.33 kg/m  Wt Readings from Last 3 Encounters:  03/11/24 109 lb 3.2 oz (49.5 kg)  12/05/23 112 lb 6.4 oz (51 kg)   10/31/23 114 lb 6.4 oz (51.9 kg)    Physical Exam Vitals reviewed.  Constitutional:      General: She is not in acute distress.    Appearance: Normal appearance.  HENT:     Head: Normocephalic and atraumatic.     Right Ear: External ear normal.     Left Ear: External ear normal.     Mouth/Throat:     Pharynx: No oropharyngeal exudate or posterior oropharyngeal erythema.  Eyes:     General: No scleral icterus.       Right eye: No discharge.        Left eye: No discharge.     Conjunctiva/sclera: Conjunctivae normal.  Neck:     Thyroid : No thyromegaly.  Cardiovascular:     Rate and Rhythm: Normal rate and regular rhythm.  Pulmonary:     Effort: No respiratory distress.     Breath sounds: Normal breath sounds. No wheezing.  Abdominal:     General: Bowel sounds are normal.     Palpations: Abdomen is soft.     Tenderness: There is no abdominal tenderness.  Musculoskeletal:        General: No swelling or tenderness.     Cervical back: Neck supple. No tenderness.  Lymphadenopathy:     Cervical: No cervical adenopathy.  Skin:    Findings: No erythema or rash.  Neurological:     Mental Status: She is alert.  Psychiatric:        Mood and Affect: Mood normal.        Behavior: Behavior normal.         Outpatient Encounter Medications as of 03/11/2024  Medication Sig   ASPIRIN 81 PO Take 1 tablet by mouth at bedtime.   famotidine (PEPCID) 20 MG tablet Take 20 mg by mouth daily. (Patient taking differently: Take 20 mg by mouth daily as needed.)   levothyroxine  (SYNTHROID ) 25 MCG tablet TAKE 1 TABLET BY MOUTH ON M,W,F. TAKE 1 1/2 TABLETS BY MOUTH ALL OTHER DAYS.   lisinopril -hydrochlorothiazide  (ZESTORETIC ) 20-12.5 MG tablet Take 1 tablet by mouth daily.   simvastatin  (ZOCOR ) 40 MG tablet Take 1 tablet (40 mg total) by mouth daily.   sertraline  (ZOLOFT ) 25 MG tablet Take 1 tablet (25 mg total) by mouth daily.   [DISCONTINUED] sertraline  (ZOLOFT ) 25 MG tablet Take 1 tablet (25  mg total) by mouth daily.   No facility-administered encounter medications on file as of 03/11/2024.     Lab Results  Component Value Date   WBC 5.0 10/05/2023  HGB 12.1 10/05/2023   HCT 35.8 (L) 10/05/2023   PLT 163.0 10/05/2023   GLUCOSE 103 (H) 02/05/2024   CHOL 129 02/05/2024   TRIG 69.0 02/05/2024   HDL 74.20 02/05/2024   LDLCALC 41 02/05/2024   ALT 15 02/05/2024   AST 30 02/05/2024   NA 134 (L) 02/18/2024   K 4.3 02/05/2024   CL 96 02/05/2024   CREATININE 1.07 02/05/2024   BUN 20 02/05/2024   CO2 28 02/05/2024   TSH 1.14 10/05/2023   HGBA1C 6.0 02/05/2024    MM 3D DIAGNOSTIC MAMMOGRAM BILATERAL BREAST Result Date: 07/12/2023 CLINICAL DATA:  Follow-up of LEFT breast calcifications EXAM: DIGITAL DIAGNOSTIC BILATERAL MAMMOGRAM WITH TOMOSYNTHESIS AND CAD TECHNIQUE: Bilateral digital diagnostic mammography and breast tomosynthesis was performed. The images were evaluated with computer-aided detection. COMPARISON:  Previous exam(s). ACR Breast Density Category c: The breasts are heterogeneously dense, which may obscure small masses. FINDINGS: Spot magnification views of the LEFT breast demonstrate 2 groups of benign vascular calcifications in the outer breast at middle and posterior depth. No suspicious mass, distortion, or microcalcifications are identified to suggest presence of malignancy bilaterally. IMPRESSION: No mammographic evidence of malignancy bilaterally. RECOMMENDATION: Screening mammogram in one year.(Code:SM-B-01Y) I have discussed the findings and recommendations with the patient. If applicable, a reminder letter will be sent to the patient regarding the next appointment. BI-RADS CATEGORY  2: Benign. Electronically Signed   By: Corean Salter M.D.   On: 07/12/2023 10:51       Assessment & Plan:  Estrogen deficiency -     Sertraline  HCl; Take 1 tablet (25 mg total) by mouth daily.  Dispense: 90 tablet; Refill: 1 -     DG Bone Density; Future  Osteoporosis  without current pathological fracture, unspecified osteoporosis type Assessment & Plan: Documented history of osteoporosis.  Calcium, vitamin d and weight bearing exercise.  Needs f/u bone density.    Hypercholesteremia Assessment & Plan: Continues on simvastatin . Follow lipid panel and liver function tests.  Follow liver panel.   Orders: -     Lipid panel; Future -     Hepatic function panel; Future  Hyperglycemia Assessment & Plan: Documented history of elevated blood sugar. Follow met b and A1c.  Lab Results  Component Value Date   HGBA1C 6.0 02/05/2024     Orders: -     Hemoglobin A1c; Future  Hypertension, essential Assessment & Plan: Continue lisinopril /hydrochlorothiazide .  Follow pressures.  Follow metabolic panel today.   Orders: -     Basic metabolic panel with GFR; Future  Stage 3 chronic kidney disease, unspecified whether stage 3a or 3b CKD (HCC) Assessment & Plan: Continue ace inhibitor. Avoid antiinflammatory medication. Stay hydrated. Follow metabolic panel.    Stress Assessment & Plan: Doing well on zoloft . Continue current dose. Follow. No change today.       Allena Hamilton, MD

## 2024-03-16 ENCOUNTER — Encounter: Payer: Self-pay | Admitting: Internal Medicine

## 2024-03-16 NOTE — Assessment & Plan Note (Signed)
 Continues on simvastatin . Follow lipid panel and liver function tests.  Follow liver panel.

## 2024-03-16 NOTE — Assessment & Plan Note (Signed)
 Doing well on zoloft . Continue current dose. Follow. No change today.

## 2024-03-16 NOTE — Assessment & Plan Note (Signed)
 Continue lisinopril /hydrochlorothiazide .  Follow pressures.  Follow metabolic panel today.

## 2024-03-16 NOTE — Assessment & Plan Note (Signed)
 Continue ace inhibitor. Avoid antiinflammatory medication. Stay hydrated. Follow metabolic panel.

## 2024-03-16 NOTE — Assessment & Plan Note (Signed)
 Documented history of elevated blood sugar. Follow met b and A1c.  Lab Results  Component Value Date   HGBA1C 6.0 02/05/2024

## 2024-03-16 NOTE — Assessment & Plan Note (Signed)
 Documented history of osteoporosis.  Calcium, vitamin d and weight bearing exercise.  Needs f/u bone density.

## 2024-04-01 ENCOUNTER — Ambulatory Visit
Admission: RE | Admit: 2024-04-01 | Discharge: 2024-04-01 | Disposition: A | Discharge status: Home / Self Care | Source: Ambulatory Visit | Attending: Internal Medicine | Admitting: Internal Medicine

## 2024-04-01 DIAGNOSIS — E2839 Other primary ovarian failure: Secondary | ICD-10-CM | POA: Insufficient documentation

## 2024-04-02 ENCOUNTER — Ambulatory Visit: Payer: Self-pay | Admitting: Internal Medicine

## 2024-06-10 ENCOUNTER — Encounter: Admitting: Internal Medicine

## 2024-10-20 ENCOUNTER — Ambulatory Visit
